# Patient Record
Sex: Male | Born: 1975 | Race: Black or African American | Hispanic: No | Marital: Single | Smoking: Former smoker
Health system: Southern US, Community
[De-identification: ages and names within clinical notes are randomized; demographics above are authoritative.]

## PROBLEM LIST (undated history)

## (undated) DIAGNOSIS — G35 Multiple sclerosis: Secondary | ICD-10-CM

## (undated) HISTORY — DX: Multiple sclerosis: G35

---

## 2000-11-14 ENCOUNTER — Emergency Department (HOSPITAL_COMMUNITY): Admission: EM | Admit: 2000-11-14 | Discharge: 2000-11-14 | Payer: Self-pay

## 2001-09-16 ENCOUNTER — Encounter: Payer: Self-pay | Admitting: Emergency Medicine

## 2001-09-16 ENCOUNTER — Emergency Department (HOSPITAL_COMMUNITY): Admission: EM | Admit: 2001-09-16 | Discharge: 2001-09-16 | Payer: Self-pay | Admitting: Emergency Medicine

## 2001-09-25 ENCOUNTER — Emergency Department (HOSPITAL_COMMUNITY): Admission: EM | Admit: 2001-09-25 | Discharge: 2001-09-25 | Payer: Self-pay | Admitting: Emergency Medicine

## 2002-11-14 ENCOUNTER — Emergency Department (HOSPITAL_COMMUNITY): Admission: EM | Admit: 2002-11-14 | Discharge: 2002-11-14 | Payer: Self-pay | Admitting: *Deleted

## 2010-03-23 ENCOUNTER — Emergency Department (HOSPITAL_COMMUNITY)
Admission: EM | Admit: 2010-03-23 | Discharge: 2010-03-23 | Payer: Self-pay | Source: Home / Self Care | Admitting: Emergency Medicine

## 2010-10-20 ENCOUNTER — Emergency Department (HOSPITAL_COMMUNITY)
Admission: EM | Admit: 2010-10-20 | Discharge: 2010-10-20 | Disposition: A | Discharge status: Home / Self Care | Payer: Self-pay | Attending: Emergency Medicine | Admitting: Emergency Medicine

## 2010-10-20 DIAGNOSIS — K047 Periapical abscess without sinus: Secondary | ICD-10-CM | POA: Insufficient documentation

## 2010-10-20 DIAGNOSIS — K029 Dental caries, unspecified: Secondary | ICD-10-CM | POA: Insufficient documentation

## 2010-10-20 DIAGNOSIS — R221 Localized swelling, mass and lump, neck: Secondary | ICD-10-CM | POA: Insufficient documentation

## 2010-10-20 DIAGNOSIS — R220 Localized swelling, mass and lump, head: Secondary | ICD-10-CM | POA: Insufficient documentation

## 2010-10-20 DIAGNOSIS — K089 Disorder of teeth and supporting structures, unspecified: Secondary | ICD-10-CM | POA: Insufficient documentation

## 2010-10-24 ENCOUNTER — Emergency Department (HOSPITAL_COMMUNITY)
Admission: EM | Admit: 2010-10-24 | Discharge: 2010-10-25 | Disposition: A | Discharge status: Home / Self Care | Payer: Self-pay | Attending: Emergency Medicine | Admitting: Emergency Medicine

## 2010-10-24 ENCOUNTER — Emergency Department (HOSPITAL_COMMUNITY): Payer: Self-pay

## 2010-10-24 DIAGNOSIS — R197 Diarrhea, unspecified: Secondary | ICD-10-CM | POA: Insufficient documentation

## 2010-10-24 DIAGNOSIS — K029 Dental caries, unspecified: Secondary | ICD-10-CM | POA: Insufficient documentation

## 2010-10-24 DIAGNOSIS — R112 Nausea with vomiting, unspecified: Secondary | ICD-10-CM | POA: Insufficient documentation

## 2010-10-24 DIAGNOSIS — K047 Periapical abscess without sinus: Secondary | ICD-10-CM | POA: Insufficient documentation

## 2010-10-24 LAB — COMPREHENSIVE METABOLIC PANEL
ALT: 38 U/L (ref 0–53)
AST: 43 U/L — ABNORMAL HIGH (ref 0–37)
Albumin: 5.6 g/dL — ABNORMAL HIGH (ref 3.5–5.2)
Alkaline Phosphatase: 96 U/L (ref 39–117)
BUN: 17 mg/dL (ref 6–23)
CO2: 18 mEq/L — ABNORMAL LOW (ref 19–32)
Calcium: 10.8 mg/dL — ABNORMAL HIGH (ref 8.4–10.5)
Chloride: 102 mEq/L (ref 96–112)
Creatinine, Ser: 0.97 mg/dL (ref 0.50–1.35)
GFR calc Af Amer: >60 mL/min (ref >60)
GFR calc non Af Amer: >60 mL/min (ref >60)
Glucose, Bld: 137 mg/dL — ABNORMAL HIGH (ref 70–99)
Potassium: 3.8 mEq/L (ref 3.5–5.1)
Sodium: 140 mEq/L (ref 135–145)
Total Bilirubin: 0.6 mg/dL (ref 0.3–1.2)
Total Protein: 9 g/dL — ABNORMAL HIGH (ref 6.0–8.3)

## 2010-10-24 LAB — DIFFERENTIAL
Basophils Absolute: 0 10*3/uL (ref 0.0–0.1)
Basophils Relative: 0 % (ref 0–1)
Eosinophils Absolute: 0 10*3/uL (ref 0.0–0.7)
Eosinophils Relative: 0 % (ref 0–5)
Lymphocytes Relative: 7 % — ABNORMAL LOW (ref 12–46)
Lymphs Abs: 1.7 10*3/uL (ref 0.7–4.0)
Monocytes Absolute: 0.7 10*3/uL (ref 0.1–1.0)
Monocytes Relative: 3 % (ref 3–12)
Neutro Abs: 21.2 10*3/uL — ABNORMAL HIGH (ref 1.7–7.7)
Neutrophils Relative %: 90 % — ABNORMAL HIGH (ref 43–77)

## 2010-10-24 LAB — CBC
HCT: 41.7 % (ref 39.0–52.0)
Hemoglobin: 14.6 g/dL (ref 13.0–17.0)
MCH: 31.5 pg (ref 26.0–34.0)
MCHC: 35 g/dL (ref 30.0–36.0)
MCV: 89.9 fL (ref 78.0–100.0)
Platelets: 299 10*3/uL (ref 150–400)
RBC: 4.64 MIL/uL (ref 4.22–5.81)
RDW: 12.7 % (ref 11.5–15.5)
WBC: 23.6 10*3/uL — ABNORMAL HIGH (ref 4.0–10.5)

## 2010-10-24 LAB — LIPASE, BLOOD: Lipase: 8 U/L — ABNORMAL LOW (ref 11–59)

## 2010-10-24 LAB — GLUCOSE, CAPILLARY: Glucose-Capillary: 135 mg/dL — ABNORMAL HIGH (ref 70–99)

## 2010-10-24 MED ORDER — IOHEXOL 300 MG/ML  SOLN
75.0000 mL | Freq: Once | INTRAMUSCULAR | Status: AC | PRN
  Administered 2010-10-24: 75 mL via INTRAVENOUS

## 2010-10-25 LAB — URINALYSIS, ROUTINE W REFLEX MICROSCOPIC
Bilirubin Urine: NEGATIVE
Ketones, ur: >80 mg/dL — AB
Leukocytes, UA: NEGATIVE
Nitrite: NEGATIVE
Protein, ur: NEGATIVE mg/dL
Urobilinogen, UA: 0.2 mg/dL (ref 0.0–1.0)
pH: 8 (ref 5.0–8.0)

## 2012-12-08 IMAGING — CT CT NECK W/ CM
3 series · 16 of 33 positions shown, 19 images · IV contrast (agent unspecified)
Comparison: None.

CLINICAL DATA: Nausea and vomiting.  Difficulty opening jaw.

CT NECK WITH CONTRAST
TECHNIQUE: Multidetector CT imaging of the neck was performed with
intravenous contrast.
Contrast: 75 ml 1mnipaque-LUU

[Series 3: st neck 2.0 b31s · axial · 0.55mm/px · z∈[+858,+1056]mm · 8 of 117 slices shown, 10 images]
[im 9/117  soft-tissue]
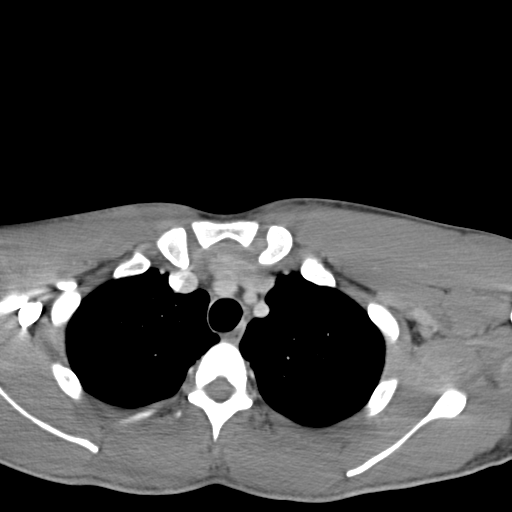
[im 9/117  bone]
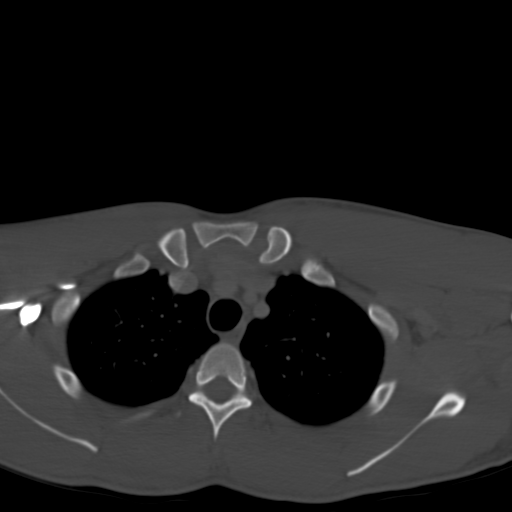
[im 27/117  bone]
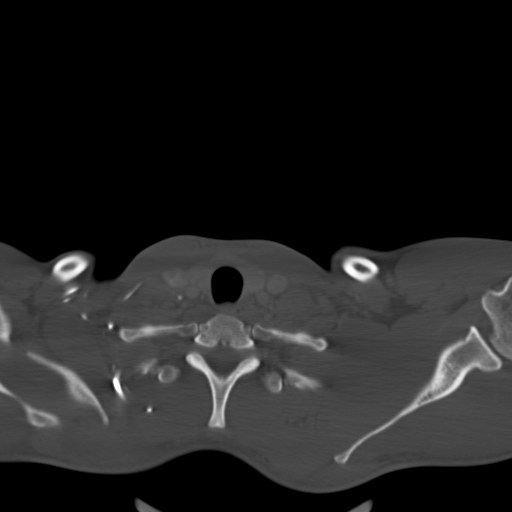
[im 36/117  bone]
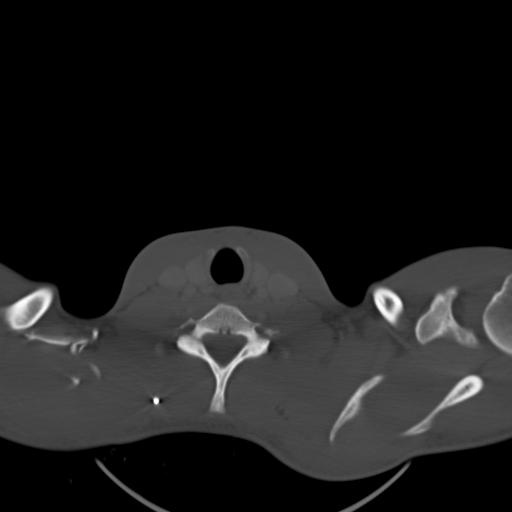
[im 54/117  bone]
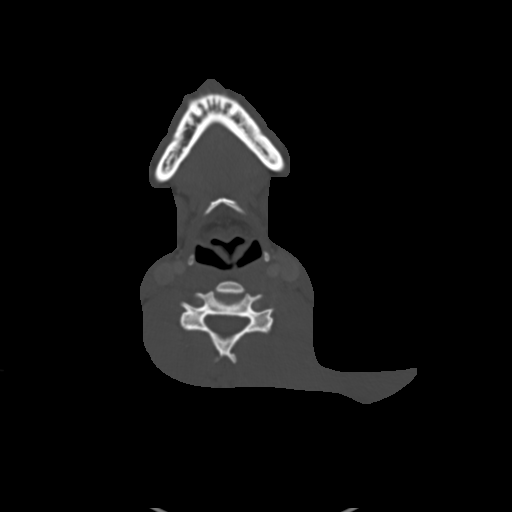
[im 63/117  soft-tissue]
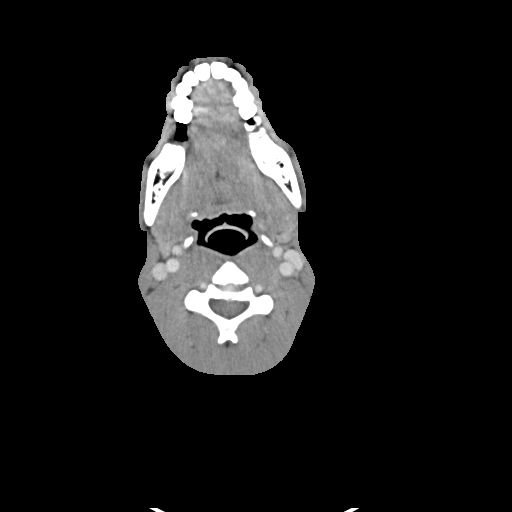
[im 63/117  bone]
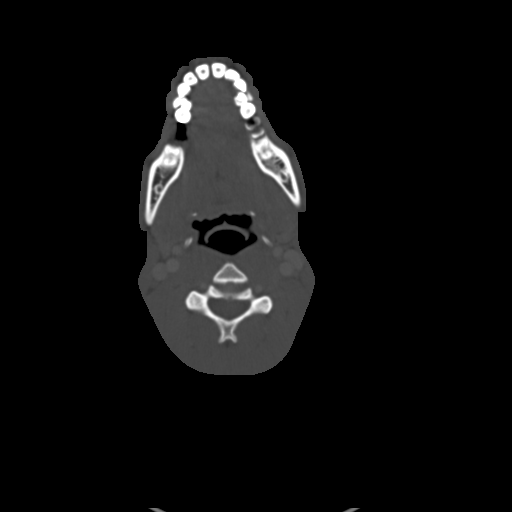
[im 81/117  bone]
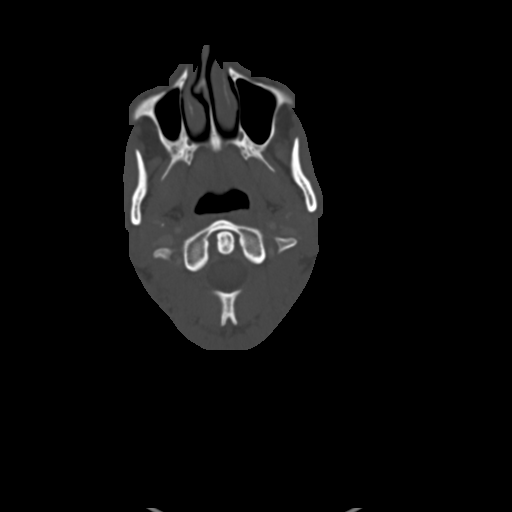
[im 90/117  bone]
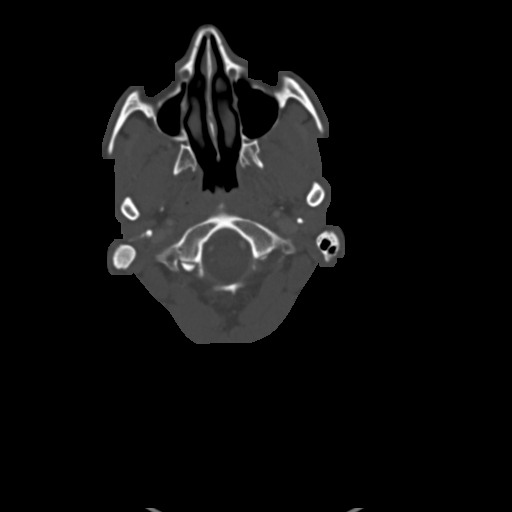
[im 108/117  bone]
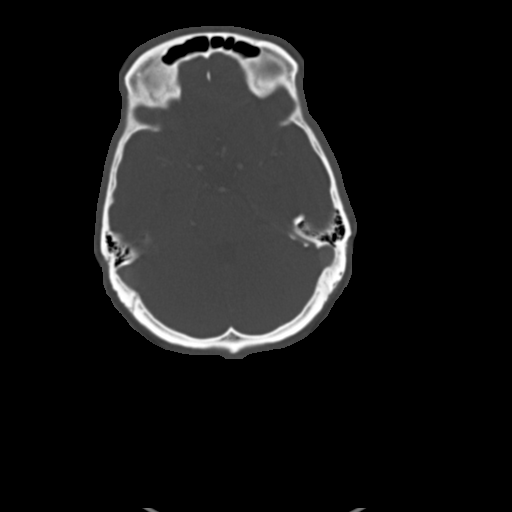

[Series 602: cor · coronal · 0.55mm/px · 3 of 101 slices shown]
[im 21/101  bone]
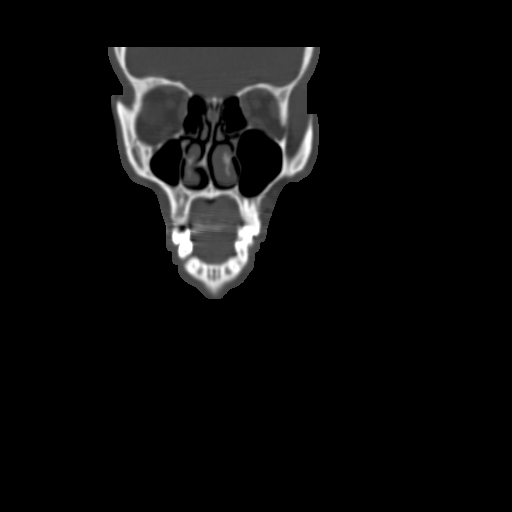
[im 41/101  bone]
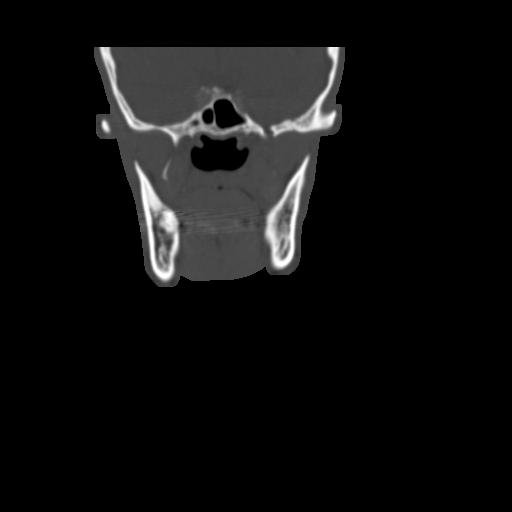
[im 61/101  bone]
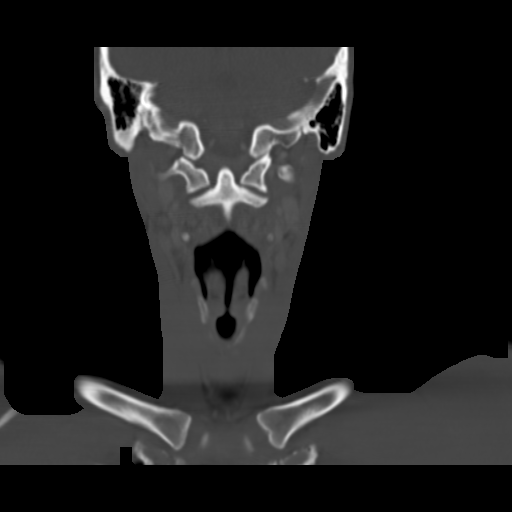

[Series 603: sag · sagittal · 0.55mm/px · 5 of 66 slices shown, 6 images]
[im 22/66  bone]
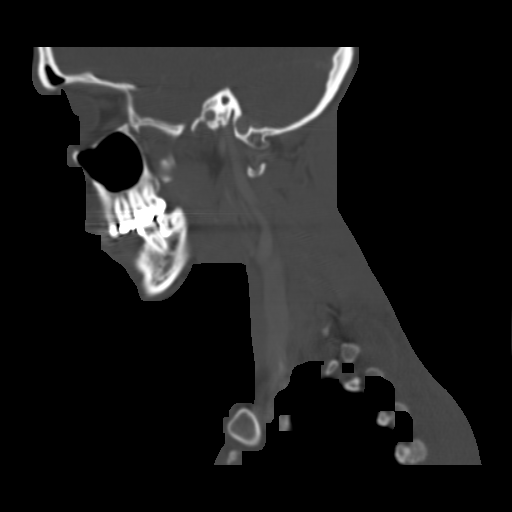
[im 28/66  bone]
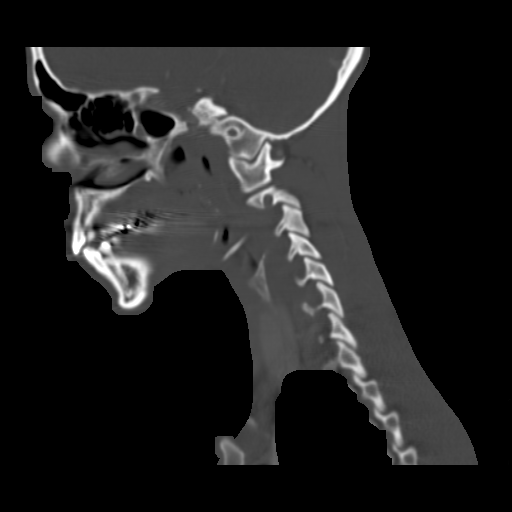
[im 33/66  soft-tissue]
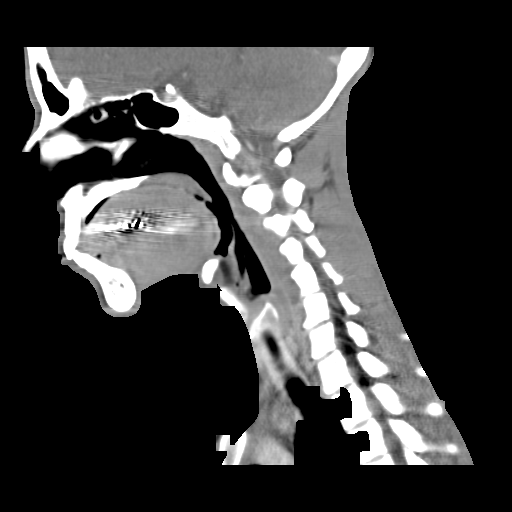
[im 33/66  bone]
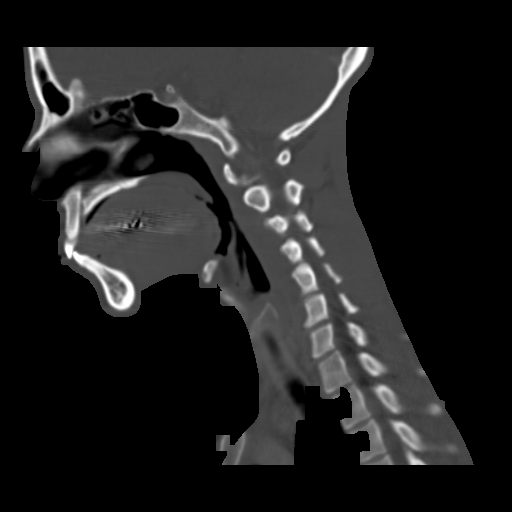
[im 38/66  bone]
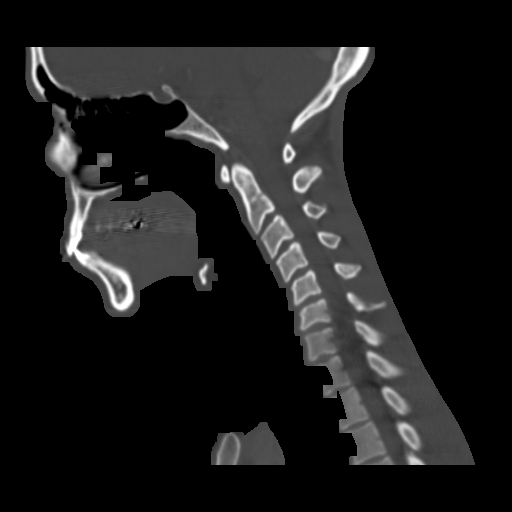
[im 44/66  bone]
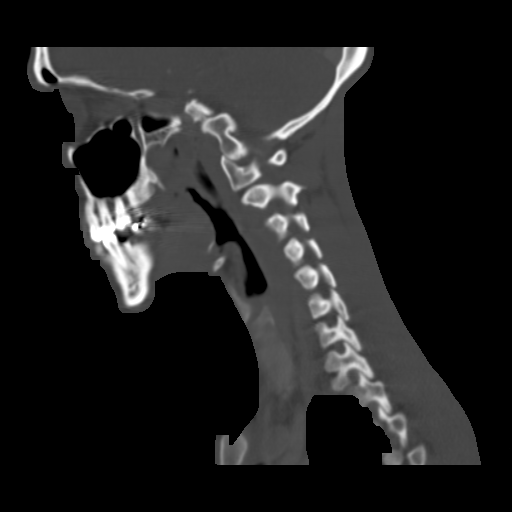

[16 of 33 positions shown; findings below may reference images not displayed]

FINDINGS: The mandibular condyles appear normal.

There is a cavity of tooth #19 along with periapical lucency.

I do not observe an abscess.

No pathologic adenopathy in the neck is identified.
No significant asymmetry of density along the mass and the mass or
buccinator musculature.
IMPRESSION: 1.  Cavity of tooth #19 with periapical lucency in the mandible.
No discrete abscess.  No significant asymmetry in the soft tissues
adjacent to the jaw.

## 2013-02-19 ENCOUNTER — Ambulatory Visit (INDEPENDENT_AMBULATORY_CARE_PROVIDER_SITE_OTHER): Payer: BC Managed Care – PPO | Admitting: Diagnostic Neuroimaging

## 2013-02-19 ENCOUNTER — Encounter (INDEPENDENT_AMBULATORY_CARE_PROVIDER_SITE_OTHER): Payer: Self-pay

## 2013-02-19 ENCOUNTER — Encounter: Payer: Self-pay | Admitting: Diagnostic Neuroimaging

## 2013-02-19 VITALS — BP 118/70 | HR 64 | Temp 98.1°F | Ht 67.0 in | Wt 133.0 lb

## 2013-02-19 DIAGNOSIS — R209 Unspecified disturbances of skin sensation: Secondary | ICD-10-CM

## 2013-02-19 DIAGNOSIS — H4921 Sixth [abducent] nerve palsy, right eye: Secondary | ICD-10-CM

## 2013-02-19 DIAGNOSIS — H4920 Sixth [abducent] nerve palsy, unspecified eye: Secondary | ICD-10-CM

## 2013-02-19 DIAGNOSIS — R42 Dizziness and giddiness: Secondary | ICD-10-CM

## 2013-02-19 DIAGNOSIS — R51 Headache: Secondary | ICD-10-CM

## 2013-02-19 DIAGNOSIS — R200 Anesthesia of skin: Secondary | ICD-10-CM

## 2013-02-19 NOTE — Patient Instructions (Signed)
I will check MRI and lab testing. 

## 2013-02-19 NOTE — Progress Notes (Signed)
[facility]  PATIENT: Shane Rollins DOB: 05/28/1975  REFERRING CLINICIAN: Harlon Flor HISTORY FROM: patient REASON FOR VISIT: new consult   HISTORICAL  CHIEF COMPLAINT:  Chief Complaint  Patient presents with  . Diplopia    R eye  . Numbness    L side    HISTORY OF PRESENT ILLNESS:   37 year old right-handed male here for evaluation of double vision.  2 weeks ago patient sudden onset double vision when looking to the right side. 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 Symptoms have been stable since that time. When patient covers one eye he does not have double vision. Symptoms are worse when he looks to the right or when he is outdoors.  No prodromal illness, infection, cough, cold, change in diet or exercise. No trauma as.  In September an episode of left arm and left leg tingling sensation lasting for several hours.  Patient had intermittent headaches and dizziness.   REVIEW OF SYSTEMS: Full 14 system review of systems performed and notable only for double vision fatigue many years.  ALLERGIES: No Known Allergies  HOME MEDICATIONS: No outpatient prescriptions prior to visit.   No facility-administered medications prior to visit.    PAST MEDICAL HISTORY: History reviewed. No pertinent past medical history.  PAST SURGICAL HISTORY: History reviewed. No pertinent past surgical history.  FAMILY HISTORY: Family History  Problem Relation Age of Onset  . Congestive Heart Failure    . Depression      SOCIAL HISTORY:  History   Social History  . Marital Status: Single    Spouse Name: N/A    Number of Children: 0  . Years of Education: BA   Occupational History  .  [employer]   Social History Main Topics  . Smoking status: Former Smoker -- 0.50 packs/day for 12 years    Types: Cigarettes    Quit date: 03/15/2007  . Smokeless tobacco: Never Used  . Alcohol Use: Yes     Comment: 2 drinks  weekly  . Drug Use: No  . Sexual Activity: Not on file   Other Topics Concern  . Not on file   Social History Narrative   Patient lives at home alone.   Caffeine Use: 1 cup daily     PHYSICAL EXAM  Filed Vitals:   02/19/13 1005  BP: 118/70  Pulse: 64  Temp: 98.1 F (36.7 C)  TempSrc: Oral  Height: 5\' 7"  (1.702 m)  Weight: 133 lb (60.328 kg)    Not recorded    Body mass index is 20.83 kg/(m^2).  GENERAL EXAM: Patient is in no distress; well developed, nourished and groomed; neck is supple  CARDIOVASCULAR: Regular rate and rhythm, no murmurs, no carotid bruits  NEUROLOGIC: MENTAL STATUS: awake, alert, oriented to person, place and time, recent and remote memory intact, normal attention and concentration, language fluent, comprehension intact, naming intact, fund of knowledge appropriate CRANIAL NERVE: no papilledema on fundoscopic exam, pupils equal and reactive to light, visual fields full to confrontation, extraocular muscles: RIGHT LATERAL RECTUS WEAKNESS WITH SUBJECTIVE BINOCULAR DOUBLE VISION ON RIGHT GAZE. No nystagmus, facial sensation and strength symmetric, hearing intact, palate elevates symmetrically, uvula midline, shoulder shrug symmetric, tongue midline. MOTOR: normal bulk and tone, full strength in the BUE, BLE SENSORY: normal and symmetric to light touch, temperature, vibration and proprioception COORDINATION: finger-nose-finger, fine finger movements normal REFLEXES: deep tendon reflexes present and symmetric GAIT/STATION: narrow based gait; able to walk  on toes, heels and tandem; romberg is negative    DIAGNOSTIC DATA (LABS, IMAGING, TESTING) - I reviewed patient records, labs, notes, testing and imaging myself where available.  Lab Results  Component Value Date   WBC 23.6* 10/24/2010   HGB 14.6 10/24/2010   HCT 41.7 10/24/2010   MCV 89.9 10/24/2010   PLT 299 10/24/2010      Component Value Date/Time   NA 140 10/24/2010 2234   K 3.8 10/24/2010  2234   CL 102 10/24/2010 2234   CO2 18* 10/24/2010 2234   GLUCOSE 137* 10/24/2010 2234   BUN 17 10/24/2010 2234   CREATININE 0.97 10/24/2010 2234   CALCIUM 10.8* 10/24/2010 2234   PROT 9.0* 10/24/2010 2234   ALBUMIN 5.6* 10/24/2010 2234   AST 43* 10/24/2010 2234   ALT 38 10/24/2010 2234   ALKPHOS 96 10/24/2010 2234   BILITOT 0.6 10/24/2010 2234   GFRNONAA >60 10/24/2010 2234   GFRAA >60 10/24/2010 2234   No results found for this basename: CHOL, HDL, LDLCALC, LDLDIRECT, TRIG, CHOLHDL   No results found for this basename: HGBA1C   No results found for this basename: VITAMINB12   No results found for this basename: TSH    No imaging in system.   ASSESSMENT AND PLAN  37 y.o. year old male here with sudden onset double vision 2 weeks ago. No improvement in symptoms since that time. We'll need to proceed with an extensive testing to look for etiologies.  Localization: right CN6 (nerve vs nucleus) vs right lateral rectus muscle   Ddx: metabolic, autoimmune, inflamm, post-infx, structural  PLAN: Orders Placed This Encounter  Procedures  . MR Brain W Wo Contrast  . MR MRA HEAD WO CONTRAST  . CBC With differential/Platelet  . Comprehensive metabolic panel  . Hemoglobin A1c  . Sedimentation Rate  . C-reactive Protein  . ANA w/Reflex if Positive  . Acetylcholine Receptor, Binding  . Angiotensin converting enzyme  . TSH  . Lyme, Total Ab Test/Reflex   Return in about 1 month (around 03/22/2013).  I reviewed records, labs, notes myself. I summarized findings and reviewed with patient, for this high risk condition (sudden right CN6 palsy) requiring high complexity decision making.   Suanne Marker, MD 02/19/2013, 11:08 AM Certified in Neurology, Neurophysiology and Neuroimaging  [facility] [address] [place], [place] [address] [phone]

## 2013-02-20 LAB — CBC WITH DIFFERENTIAL
Basophils Absolute: 0.1 10*3/uL (ref 0.0–0.2)
Basos: 1 %
Eosinophils Absolute: 0.1 10*3/uL (ref 0.0–0.4)
Hemoglobin: 14.7 g/dL (ref 12.6–17.7)
Immature Grans (Abs): 0 10*3/uL (ref 0.0–0.1)
Lymphs: 33 %
MCH: 31.7 pg (ref 26.6–33.0)
MCHC: 35.1 g/dL (ref 31.5–35.7)
Monocytes Absolute: 0.5 10*3/uL (ref 0.1–0.9)
Neutrophils Relative %: 58 %
RDW: 13 % (ref 12.3–15.4)

## 2013-02-20 LAB — COMPREHENSIVE METABOLIC PANEL
Alkaline Phosphatase: 78 IU/L (ref 39–117)
Chloride: 100 mmol/L (ref 97–108)
GFR calc Af Amer: 127 mL/min/{1.73_m2} (ref >59)
GFR calc non Af Amer: 110 mL/min/{1.73_m2} (ref >59)
Globulin, Total: 2.5 g/dL (ref 1.5–4.5)
Potassium: 4.8 mmol/L (ref 3.5–5.2)
Sodium: 140 mmol/L (ref 134–144)

## 2013-02-20 LAB — SEDIMENTATION RATE: Sed Rate: 2 mm/hr (ref 0–15)

## 2013-02-20 LAB — C-REACTIVE PROTEIN: CRP: 0.8 mg/L (ref 0.0–4.9)

## 2013-02-20 LAB — HEMOGLOBIN A1C: Est. average glucose Bld gHb Est-mCnc: 114 mg/dL

## 2013-02-20 LAB — ANA W/REFLEX IF POSITIVE: Anti Nuclear Antibody(ANA): NEGATIVE

## 2013-02-20 LAB — ACETYLCHOLINE RECEPTOR, BINDING: AChR Binding Ab, Serum: 0.08 nmol/L (ref 0.00–0.24)

## 2013-02-22 DIAGNOSIS — H4920 Sixth [abducent] nerve palsy, unspecified eye: Secondary | ICD-10-CM | POA: Insufficient documentation

## 2013-02-25 ENCOUNTER — Telehealth: Payer: Self-pay | Admitting: Diagnostic Neuroimaging

## 2013-02-25 NOTE — Telephone Encounter (Signed)
Calling for lab results. °

## 2013-02-25 NOTE — Telephone Encounter (Signed)
Please advise 

## 2013-02-28 ENCOUNTER — Ambulatory Visit (INDEPENDENT_AMBULATORY_CARE_PROVIDER_SITE_OTHER): Payer: BC Managed Care – PPO

## 2013-02-28 DIAGNOSIS — H4921 Sixth [abducent] nerve palsy, right eye: Secondary | ICD-10-CM

## 2013-02-28 DIAGNOSIS — H4920 Sixth [abducent] nerve palsy, unspecified eye: Secondary | ICD-10-CM

## 2013-02-28 NOTE — Telephone Encounter (Signed)
Patient stopped at checkout after MRI to check the status of his results.

## 2013-03-01 MED ORDER — GADOPENTETATE DIMEGLUMINE 469.01 MG/ML IV SOLN
12.0000 mL | Freq: Once | INTRAVENOUS | Status: AC | PRN
Start: 2013-03-01 — End: 2013-03-01

## 2013-03-01 NOTE — Telephone Encounter (Signed)
I have talked with the patient and he agreed to wait until the beginning of next week for his results.

## 2013-03-01 NOTE — Telephone Encounter (Signed)
I called patient to give MRI results, but no answer. Will try again on Monday, and offer follow appt for Mon or Tues. -VRP

## 2013-03-04 ENCOUNTER — Ambulatory Visit (INDEPENDENT_AMBULATORY_CARE_PROVIDER_SITE_OTHER): Payer: BC Managed Care – PPO | Admitting: Diagnostic Neuroimaging

## 2013-03-04 ENCOUNTER — Encounter: Payer: Self-pay | Admitting: Diagnostic Neuroimaging

## 2013-03-04 VITALS — BP 123/73 | HR 73 | Ht 67.0 in | Wt 132.0 lb

## 2013-03-04 DIAGNOSIS — H4921 Sixth [abducent] nerve palsy, right eye: Secondary | ICD-10-CM

## 2013-03-04 DIAGNOSIS — R200 Anesthesia of skin: Secondary | ICD-10-CM

## 2013-03-04 DIAGNOSIS — R42 Dizziness and giddiness: Secondary | ICD-10-CM

## 2013-03-04 DIAGNOSIS — R930 Abnormal findings on diagnostic imaging of skull and head, not elsewhere classified: Secondary | ICD-10-CM

## 2013-03-04 DIAGNOSIS — R209 Unspecified disturbances of skin sensation: Secondary | ICD-10-CM

## 2013-03-04 DIAGNOSIS — R9089 Other abnormal findings on diagnostic imaging of central nervous system: Secondary | ICD-10-CM

## 2013-03-04 DIAGNOSIS — R51 Headache: Secondary | ICD-10-CM

## 2013-03-04 DIAGNOSIS — H4920 Sixth [abducent] nerve palsy, unspecified eye: Secondary | ICD-10-CM

## 2013-03-04 NOTE — Progress Notes (Signed)
[facility]  PATIENT: Shane Rollins DOB: [birth year]  REFERRING CLINICIAN: Whitaker HISTORY FROM: patient and friend REASON FOR VISIT: follow up   HISTORICAL  CHIEF COMPLAINT:  Chief Complaint  Patient presents with  . Follow-up    6 nerve palsy    HISTORY OF PRESENT ILLNESS:   UPDATE 03/04/13: Since last visit, symptoms stable. MRI results reviewed. No new events.  PRIOR HPI (02/19/13): 37 year old right-handed male here for evaluation of double vision.  2 weeks ago patient sudden onset double vision when looking to the right side. 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 Symptoms have been stable since that time. When patient covers one eye he does not have double vision. Symptoms are worse when he looks to the right or when he is outdoors.  No prodromal illness, infection, cough, cold, change in diet or exercise. No traumas.  In September an episode of left arm and left leg tingling sensation lasting for several hours.  Patient had intermittent headaches and dizziness.   REVIEW OF SYSTEMS: Full 14 system review of systems performed and notable only for double vision numbness.  ALLERGIES: No Known Allergies  HOME MEDICATIONS: Outpatient Prescriptions Prior to Visit  Medication Sig Dispense Refill  . vitamin B-12 (CYANOCOBALAMIN) 100 MCG tablet Take 100 mcg by mouth daily.       No facility-administered medications prior to visit.    PAST MEDICAL HISTORY: History reviewed. No pertinent past medical history.  PAST SURGICAL HISTORY: History reviewed. No pertinent past surgical history.  FAMILY HISTORY: Family History  Problem Relation Age of Onset  . Congestive Heart Failure    . Depression      SOCIAL HISTORY:  History   Social History  . Marital Status: Single    Spouse Name: N/A    Number of Children: 0  . Years of Education: BA   Occupational History  .  [employer]   Social History  Main Topics  . Smoking status: Former Smoker -- 0.50 packs/day for 12 years    Types: Cigarettes    Quit date: 03/15/2007  . Smokeless tobacco: Never Used  . Alcohol Use: Yes     Comment: 2 drinks weekly  . Drug Use: No  . Sexual Activity: Not on file   Other Topics Concern  . Not on file   Social History Narrative   Patient lives at home alone.   Caffeine Use: 1 cup daily     PHYSICAL EXAM  Filed Vitals:   03/04/13 1350  BP: 123/73  Pulse: 73  Height: 5\' 7"  (1.702 m)  Weight: 132 lb (59.875 kg)    Not recorded    Body mass index is 20.67 kg/(m^2).  GENERAL EXAM: Patient is in no distress; well developed, nourished and groomed; neck is supple  CARDIOVASCULAR: Regular rate and rhythm, no murmurs, no carotid bruits  NEUROLOGIC: MENTAL STATUS: awake, alert, oriented to person, place and time, recent and remote memory intact, normal attention and concentration, language fluent, comprehension intact, naming intact, fund of knowledge appropriate CRANIAL NERVE: no papilledema on fundoscopic exam, pupils equal and reactive to light, visual fields full to confrontation, extraocular muscles: RIGHT LATERAL RECTUS WEAKNESS WITH SUBJECTIVE BINOCULAR DOUBLE VISION ON RIGHT GAZE. No nystagmus, facial sensation and strength symmetric, hearing intact, palate elevates symmetrically, uvula midline, shoulder shrug symmetric, tongue midline. MOTOR: normal bulk and tone, full strength in the BUE, BLE SENSORY: normal and symmetric to light touch COORDINATION: finger-nose-finger,  fine finger movements normal REFLEXES: deep tendon reflexes present and symmetric GAIT/STATION: narrow based gait; able to walk tandem; romberg is negative    DIAGNOSTIC DATA (LABS, IMAGING, TESTING) - I reviewed patient records, labs, notes, testing and imaging myself where available.  Lab Results  Component Value Date   WBC 7.5 02/19/2013   HGB 14.7 02/19/2013   HCT 41.9 02/19/2013   MCV 90 02/19/2013   PLT  270 02/19/2013      Component Value Date/Time   NA 140 02/19/2013 1127   NA 140 10/24/2010 2234   K 4.8 02/19/2013 1127   CL 100 02/19/2013 1127   CO2 24 02/19/2013 1127   GLUCOSE 81 02/19/2013 1127   GLUCOSE 137* 10/24/2010 2234   BUN 12 02/19/2013 1127   BUN 17 10/24/2010 2234   CREATININE 0.90 02/19/2013 1127   CALCIUM 10.5* 02/19/2013 1127   PROT 7.6 02/19/2013 1127   PROT 9.0* 10/24/2010 2234   ALBUMIN 5.6* 10/24/2010 2234   AST 24 02/19/2013 1127   ALT 20 02/19/2013 1127   ALKPHOS 78 02/19/2013 1127   BILITOT 0.4 02/19/2013 1127   GFRNONAA 110 02/19/2013 1127   GFRAA 127 02/19/2013 1127   No results found for this basename: CHOL,  HDL,  LDLCALC,  LDLDIRECT,  TRIG,  CHOLHDL   Lab Results  Component Value Date   HGBA1C 5.6 02/19/2013   No results found for this basename: VITAMINB12   Lab Results  Component Value Date   TSH 1.320 02/19/2013   I reviewed images myself and agree with interpretation. -VRP  02/28/13 MRI brain - Multiple (9 periventricular and subcortical, 2 pontine and 1 medullary ) T2 hyperintensities. The largest is in the left frontal periventricular region, which also has partial rim enhancement. Findings suspicious for acute and chronic demyelinating plaques. Other autoimmune, inflammatory or post-infectious etiologies are considerations.  02/28/13 MRA head - normal   ASSESSMENT AND PLAN  37 y.o. year old male here with sudden onset double vision 2 weeks ago. No improvement in symptoms since that time. MRI brain is suspicious for multiple sclerosis. Will check labs (for MS mimics), MRI c + t spine, then consider IV steroids, followed by disease modifying therapy.  Ddx: multiple sclerosis, CNS autoimmune, inflamm, post-infx   PLAN: Orders Placed This Encounter  Procedures  . MR Cervical Spine W Wo Contrast  . MR Thoracic Spine W Wo Contrast  . Pan-ANCA  . HIV antibody  . Quantiferon tb gold assay  . Hep B Surface Antibody  . Hep B Surface Antigen  . Hepatitis  B Core AB, Total  . Hep C Antibody  . RPR  . NMO IgG Autoantibodies  . Stratify JCV Antibody Test ([facility])  . Vitamin B12   Return in about 3 months (around 06/02/2013).     Suanne Marker, MD 03/04/2013, 2:53 PM Certified in Neurology, Neurophysiology and Neuroimaging  [facility] [address] [place], [place] [address] [phone]

## 2013-03-04 NOTE — Telephone Encounter (Signed)
Patient scheduled/ confirmed 03/04/13...as

## 2013-03-05 ENCOUNTER — Other Ambulatory Visit (INDEPENDENT_AMBULATORY_CARE_PROVIDER_SITE_OTHER): Payer: BC Managed Care – PPO

## 2013-03-05 DIAGNOSIS — Z0289 Encounter for other administrative examinations: Secondary | ICD-10-CM

## 2013-03-06 LAB — HIV ANTIBODY (ROUTINE TESTING W REFLEX)
HIV 1/O/2 Abs-Index Value: <1 (ref <1.00)
HIV-1/HIV-2 Ab: NONREACTIVE

## 2013-03-06 LAB — PAN-ANCA
Atypical pANCA: <1:20 {titer}
C-ANCA: <1:20 {titer}
P-ANCA: <1:20 {titer}

## 2013-03-06 LAB — NMO IGG AUTOANTIBODIES: NMO-IgG: <1.6 U/mL (ref 0.0–3.0)

## 2013-03-06 LAB — VITAMIN B12: Vitamin B-12: 690 pg/mL (ref 211–946)

## 2013-03-07 LAB — QUANTIFERON IN TUBE
QUANTIFERON TB AG VALUE: 0.04 IU/mL
QUANTIFERON TB GOLD: NEGATIVE
Quantiferon Nil Value: 0.04 IU/mL

## 2013-03-07 LAB — QUANTIFERON TB GOLD ASSAY (BLOOD)

## 2013-03-25 ENCOUNTER — Ambulatory Visit
Admission: RE | Admit: 2013-03-25 | Discharge: 2013-03-25 | Disposition: A | Discharge status: Home / Self Care | Payer: BC Managed Care – PPO | Source: Ambulatory Visit | Attending: Diagnostic Neuroimaging | Admitting: Diagnostic Neuroimaging

## 2013-03-25 DIAGNOSIS — R9089 Other abnormal findings on diagnostic imaging of central nervous system: Secondary | ICD-10-CM

## 2013-03-25 DIAGNOSIS — R42 Dizziness and giddiness: Secondary | ICD-10-CM

## 2013-03-25 DIAGNOSIS — R51 Headache: Secondary | ICD-10-CM

## 2013-03-25 DIAGNOSIS — R200 Anesthesia of skin: Secondary | ICD-10-CM

## 2013-03-25 DIAGNOSIS — H4921 Sixth [abducent] nerve palsy, right eye: Secondary | ICD-10-CM

## 2013-03-25 DIAGNOSIS — R209 Unspecified disturbances of skin sensation: Secondary | ICD-10-CM

## 2013-03-25 MED ORDER — GADOBENATE DIMEGLUMINE 529 MG/ML IV SOLN
13.0000 mL | Freq: Once | INTRAVENOUS | Status: AC | PRN
  Administered 2013-03-25: 13 mL via INTRAVENOUS

## 2013-04-04 ENCOUNTER — Telehealth: Payer: Self-pay | Admitting: Diagnostic Neuroimaging

## 2013-04-04 NOTE — Telephone Encounter (Signed)
Patient wanting MRI results. Please advise.  

## 2013-04-04 NOTE — Telephone Encounter (Signed)
Patient would like to know the results of his MRI

## 2013-04-08 NOTE — Telephone Encounter (Signed)
I called pt and relayed per Dr. Marjory Lies that the results of MRI thoracic normal, MRI cervical, one lesion. (suspicious of MS).    (Will be in 04-10-13 to discuss test results and MS meds).

## 2013-04-10 ENCOUNTER — Encounter: Payer: Self-pay | Admitting: Diagnostic Neuroimaging

## 2013-04-10 ENCOUNTER — Ambulatory Visit (INDEPENDENT_AMBULATORY_CARE_PROVIDER_SITE_OTHER): Payer: BC Managed Care – PPO | Admitting: Diagnostic Neuroimaging

## 2013-04-10 VITALS — BP 139/84 | HR 72 | Temp 98.4°F | Ht 67.0 in | Wt 134.0 lb

## 2013-04-10 DIAGNOSIS — G35 Multiple sclerosis: Secondary | ICD-10-CM

## 2013-04-10 MED ORDER — PREDNISONE 10 MG PO TABS
ORAL_TABLET | ORAL | Status: DC
Start: 2013-04-10 — End: 2013-05-24

## 2013-04-10 NOTE — Patient Instructions (Signed)
I will start you on tecfidera.  I will prescribe course of prednisone (steroids) for 12 days; take over the counter prilosec/prevacid daily with steroids.

## 2013-04-10 NOTE — Progress Notes (Signed)
[facility]  PATIENT: Shane Rollins DOB: [birth year]  REFERRING CLINICIAN:  HISTORY FROM: patient and friend and mother REASON FOR VISIT: follow up   HISTORICAL  CHIEF COMPLAINT:  Chief Complaint  Patient presents with  . Follow-up    HISTORY OF PRESENT ILLNESS:   UPDATE 04/10/13: Since last visit, right eye has been slightly improving. No new symptoms. Here to review test results, diagnosis, and treatment options.   UPDATE 03/04/13: Since last visit, symptoms stable. MRI results reviewed. No new events.  PRIOR HPI (02/19/13): 38 year old right-handed male here for evaluation of double vision.  2 weeks ago patient sudden onset double vision when looking to the right side. 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 Symptoms have been stable since that time. When patient covers one eye he does not have double vision. Symptoms are worse when he looks to the right or when he is outdoors.  No prodromal illness, infection, cough, cold, change in diet or exercise. No traumas.  In September an episode of left arm and left leg tingling sensation lasting for several hours.  Patient had intermittent headaches and dizziness.   REVIEW OF SYSTEMS: Full 14 system review of systems performed and notable only for double vision numbness.  ALLERGIES: No Known Allergies  HOME MEDICATIONS: Outpatient Prescriptions Prior to Visit  Medication Sig Dispense Refill  . vitamin B-12 (CYANOCOBALAMIN) 100 MCG tablet Take 100 mcg by mouth daily.       No facility-administered medications prior to visit.    PAST MEDICAL HISTORY: No past medical history on file.  PAST SURGICAL HISTORY: No past surgical history on file.  FAMILY HISTORY: Family History  Problem Relation Age of Onset  . Congestive Heart Failure    . Depression      SOCIAL HISTORY:  History   Social History  . Marital Status: Single    Spouse Name: N/A    Number of Children: 0   . Years of Education: BA   Occupational History  .  [employer]   Social History Main Topics  . Smoking status: Former Smoker -- 0.50 packs/day for 12 years    Types: Cigarettes    Quit date: 03/15/2007  . Smokeless tobacco: Never Used  . Alcohol Use: Yes     Comment: 2 drinks weekly  . Drug Use: No  . Sexual Activity: Not on file   Other Topics Concern  . Not on file   Social History Narrative   Patient lives at home alone.   Caffeine Use: 1 cup daily     PHYSICAL EXAM  Filed Vitals:   04/10/13 0954  BP: 139/84  Pulse: 72  Temp: 98.4 F (36.9 C)  TempSrc: Oral  Height: 5\' 7"  (1.702 m)  Weight: 134 lb (60.782 kg)    Not recorded    Body mass index is 20.98 kg/(m^2).  GENERAL EXAM: Patient is in no distress; well developed, nourished and groomed; neck is supple  CARDIOVASCULAR: Regular rate and rhythm, no murmurs, no carotid bruits  NEUROLOGIC: MENTAL STATUS: awake, alert, oriented to person, place and time, recent and remote memory intact, normal attention and concentration, language fluent, comprehension intact, naming intact, fund of knowledge appropriate CRANIAL NERVE: no papilledema on fundoscopic exam, pupils equal and reactive to light, visual fields full to confrontation, extraocular muscles: SUBJECTIVE BINOCULAR DOUBLE VISION ON RIGHT GAZE. No nystagmus, facial sensation and strength symmetric, hearing intact, palate elevates symmetrically, uvula midline, shoulder shrug  symmetric, tongue midline. MOTOR: normal bulk and tone, full strength in the BUE, BLE SENSORY: normal and symmetric to light touch COORDINATION: finger-nose-finger, fine finger movements normal REFLEXES: deep tendon reflexes present and symmetric GAIT/STATION: narrow based gait; able to walk toe, heel and tandem; romberg is negative    DIAGNOSTIC DATA (LABS, IMAGING, TESTING) - I reviewed patient records, labs, notes, testing and imaging myself where available.  Lab  Results  Component Value Date   WBC 7.5 02/19/2013   HGB 14.7 02/19/2013   HCT 41.9 02/19/2013   MCV 90 02/19/2013   PLT 270 02/19/2013      Component Value Date/Time   NA 140 02/19/2013 1127   NA 140 10/24/2010 2234   K 4.8 02/19/2013 1127   CL 100 02/19/2013 1127   CO2 24 02/19/2013 1127   GLUCOSE 81 02/19/2013 1127   GLUCOSE 137* 10/24/2010 2234   BUN 12 02/19/2013 1127   BUN 17 10/24/2010 2234   CREATININE 0.90 02/19/2013 1127   CALCIUM 10.5* 02/19/2013 1127   PROT 7.6 02/19/2013 1127   PROT 9.0* 10/24/2010 2234   ALBUMIN 5.6* 10/24/2010 2234   AST 24 02/19/2013 1127   ALT 20 02/19/2013 1127   ALKPHOS 78 02/19/2013 1127   BILITOT 0.4 02/19/2013 1127   GFRNONAA 110 02/19/2013 1127   GFRAA 127 02/19/2013 1127   No results found for this basename: CHOL,  HDL,  LDLCALC,  LDLDIRECT,  TRIG,  CHOLHDL   Lab Results  Component Value Date   HGBA1C 5.6 02/19/2013   Lab Results  Component Value Date   VITAMINB12 690 03/04/2013   Lab Results  Component Value Date   TSH 1.320 02/19/2013   I reviewed images myself and agree with interpretation. -VRP  02/28/13 MRI brain - Multiple (9 periventricular and subcortical, 2 pontine and 1 medullary ) T2 hyperintensities. The largest is in the left frontal periventricular region, which also has partial rim enhancement. Findings suspicious for acute and chronic demyelinating plaques. Other autoimmune, inflammatory or post-infectious etiologies are considerations.  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  - Right ponto-medullary T2 hyperintense lesion.  - Findings suspicious for chronic demyelinating disease. Other considerations include autoimmune, inflammatory, post-infectious or ischemic etiologies.  - No acute findings.  03/25/13 MRI thoracic - normal  03/04/13 Labs (B12, NMO ab, RPR, Hep B,  Hep C, HIV, ANCA, ANA, Tb) - all negative/normal  03/04/13 JCV antibody - POSITIVE 1.95   ASSESSMENT AND PLAN  38 y.o. year old male here with sudden onset double vision 2 weeks ago. Slight improvement in symptoms since that time. MRI brain is suspicious for multiple sclerosis. MRI cervical spine shows a spinal cord lesion. MS lab mimics were negative.  Dx: multiple sclerosis   PLAN: - start tecfidera - prednisone course x 12 days - over 1 hour with patient and family discussing treatment options, prognosis and diagnosis  Return in about 3 months (around 07/09/2013).    Suanne MarkerVIKRAM R. PENUMALLI, MD 04/10/2013, 11:34 AM Certified in Neurology, Neurophysiology and Neuroimaging  [facility] [address], [address] [place], [place] [address] [phone]

## 2013-05-24 ENCOUNTER — Encounter: Payer: Self-pay | Admitting: Diagnostic Neuroimaging

## 2013-05-24 ENCOUNTER — Ambulatory Visit (INDEPENDENT_AMBULATORY_CARE_PROVIDER_SITE_OTHER): Payer: BC Managed Care – PPO | Admitting: Diagnostic Neuroimaging

## 2013-05-24 VITALS — BP 115/64 | HR 65 | Temp 98.2°F | Ht 67.0 in | Wt 137.0 lb

## 2013-05-24 DIAGNOSIS — G35 Multiple sclerosis: Secondary | ICD-10-CM

## 2013-05-24 NOTE — Patient Instructions (Signed)
Continue tecfidera  Start general multivitamin

## 2013-05-24 NOTE — Progress Notes (Signed)
[facility]  PATIENT: Shane Rollins DOB: [birth year]  REFERRING CLINICIAN:  HISTORY FROM: patient and friend REASON FOR VISIT: follow up   HISTORICAL  CHIEF COMPLAINT:  Chief Complaint  Patient presents with  . Follow-up    HISTORY OF PRESENT ILLNESS:   UPDATE 05/24/13: Since last visit, vision continue to improve. Now able to drive without eye patch. Some occ double vision with extreme right gaze. Also with intermittent, brief numbness in face, burning pains in left shoulder/scapula. Overall tolerating tecfidera (started 04/24/13), with no sig side effects. Taking meds with foods. Occ "quesey" stomach, but not nausea/vomiting/diarrhea.  UPDATE 04/10/13: Since last visit, right eye has been slightly improving. No new symptoms. Here to review test results, diagnosis, and treatment options.   UPDATE 03/04/13: Since last visit, symptoms stable. MRI results reviewed. No new events.  PRIOR HPI (02/19/13): 38 year old right-handed male here for evaluation of double vision.  2 weeks ago patient sudden onset double vision when looking to the right side. 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 Symptoms have been stable since that time. When patient covers one eye he does not have double vision. Symptoms are worse when he looks to the right or when he is outdoors.  No prodromal illness, infection, cough, cold, change in diet or exercise. No traumas.  In September an episode of left arm and left leg tingling sensation lasting for several hours.  Patient had intermittent headaches and dizziness.   REVIEW OF SYSTEMS: Full 14 system review of systems performed and notable only for double vision numbness fatigue.  ALLERGIES: No Known Allergies  HOME MEDICATIONS: Outpatient Encounter Prescriptions as of 05/24/2013  Medication Sig  . TECFIDERA 120 & 240 MG MISC Take 1 capsule by mouth 2 (two) times daily.  . vitamin B-12 (CYANOCOBALAMIN) 100  MCG tablet Take 100 mcg by mouth daily.  . [DISCONTINUED] predniSONE (DELTASONE) 10 MG tablet Take 60mg  daily x 2 days. Then reduce by 10mg  every 2 days. (60, 60, 50, 50, 40, 40, 30, 30, 20, 20, 10, 10, then stop).    PAST MEDICAL HISTORY: Past Medical History  Diagnosis Date  . Multiple sclerosis    PAST SURGICAL HISTORY: History reviewed. No pertinent past surgical history.  FAMILY HISTORY: Family History  Problem Relation Age of Onset  . Congestive Heart Failure    . Depression      SOCIAL HISTORY:  History   Social History  . Marital Status: Single    Spouse Name: N/A    Number of Children: 0  . Years of Education: BA   Occupational History  .  [employer]   Social History Main Topics  . Smoking status: Former Smoker -- 0.50 packs/day for 12 years    Types: Cigarettes    Quit date: 03/15/2007  . Smokeless tobacco: Never Used  . Alcohol Use: Yes     Comment: 2 drinks weekly  . Drug Use: No  . Sexual Activity: Not on file   Other Topics Concern  . Not on file   Social History Narrative   Patient lives at home alone.   Caffeine Use: 1 cup daily     PHYSICAL EXAM  Filed Vitals:   05/24/13 0831  BP: 115/64  Pulse: 65  Temp: 98.2 F (36.8 C)  TempSrc: Oral  Height: 5\' 7"  (1.702 m)  Weight: 137 lb (62.143 kg)    Not recorded    Body mass index is  21.45 kg/(m^2).  GENERAL EXAM: Patient is in no distress; well developed, nourished and groomed; neck is supple  CARDIOVASCULAR: Regular rate and rhythm, no murmurs, no carotid bruits  NEUROLOGIC: MENTAL STATUS: awake, alert, oriented to person, place and time, recent and remote memory intact, normal attention and concentration, language fluent, comprehension intact, naming intact, fund of knowledge appropriate CRANIAL NERVE: no papilledema on fundoscopic exam, pupils equal and reactive to light, visual fields full to confrontation, extraocular muscles: SUBJECTIVE BINOCULAR DOUBLE VISION ON  EXTREME RIGHT GAZE. No nystagmus, facial sensation and strength symmetric, hearing intact, palate elevates symmetrically, uvula midline, shoulder shrug symmetric, tongue midline. MOTOR: normal bulk and tone, full strength in the BUE, BLE SENSORY: normal and symmetric to light touch COORDINATION: finger-nose-finger, fine finger movements normal REFLEXES: deep tendon reflexes present and symmetric GAIT/STATION: narrow based gait; able to walk toe, heel and tandem; romberg is negative    DIAGNOSTIC DATA (LABS, IMAGING, TESTING) - I reviewed patient records, labs, notes, testing and imaging myself where available.  Lab Results  Component Value Date   WBC 7.5 02/19/2013   HGB 14.7 02/19/2013   HCT 41.9 02/19/2013   MCV 90 02/19/2013   PLT 270 02/19/2013      Component Value Date/Time   NA 140 02/19/2013 1127   NA 140 10/24/2010 2234   K 4.8 02/19/2013 1127   CL 100 02/19/2013 1127   CO2 24 02/19/2013 1127   GLUCOSE 81 02/19/2013 1127   GLUCOSE 137* 10/24/2010 2234   BUN 12 02/19/2013 1127   BUN 17 10/24/2010 2234   CREATININE 0.90 02/19/2013 1127   CALCIUM 10.5* 02/19/2013 1127   PROT 7.6 02/19/2013 1127   PROT 9.0* 10/24/2010 2234   ALBUMIN 5.6* 10/24/2010 2234   AST 24 02/19/2013 1127   ALT 20 02/19/2013 1127   ALKPHOS 78 02/19/2013 1127   BILITOT 0.4 02/19/2013 1127   GFRNONAA 110 02/19/2013 1127   GFRAA 127 02/19/2013 1127   No results found for this basename: CHOL,  HDL,  LDLCALC,  LDLDIRECT,  TRIG,  CHOLHDL   Lab Results  Component Value Date   HGBA1C 5.6 02/19/2013   Lab Results  Component Value Date   VITAMINB12 690 03/04/2013   Lab Results  Component Value Date   TSH 1.320 02/19/2013   I reviewed images myself and agree with interpretation. -VRP  02/28/13 MRI brain - Multiple (9 periventricular and subcortical, 2 pontine and 1 medullary ) T2 hyperintensities. The largest is in the left frontal periventricular region, which also has partial rim enhancement. Findings suspicious for  acute and chronic demyelinating plaques. Other autoimmune, inflammatory or post-infectious etiologies are considerations.  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  - Right ponto-medullary T2 hyperintense lesion.  - Findings suspicious for chronic demyelinating disease. Other considerations include autoimmune, inflammatory, post-infectious or ischemic etiologies.  - No acute findings.  03/25/13 MRI thoracic - normal  03/04/13 Labs (B12, NMO ab, RPR, Hep B, Hep C, HIV, ANCA, ANA, Tb) - all negative/normal  03/04/13 JCV antibody - POSITIVE 1.95 (H)   ASSESSMENT AND PLAN  38 y.o. year old male here with sudden onset double vision in Nov/Dec 2014. MRI brain and c-spine consistent with multiple sclerosis. MS lab mimics were negative. Now on Tecfidera since 04/24/13, and doing well.  Dx: multiple sclerosis   PLAN: - continue tecfidera  Orders Placed This Encounter  Procedures  . CBC With differential/Platelet  . Vit D  25 hydroxy (rtn osteoporosis monitoring)    Return in about 4 months (around 09/23/2013).    Suanne Marker, MD 05/24/2013, 9:01 AM Certified in Neurology, Neurophysiology and Neuroimaging  [facility] [address] [place], [place] [address] [phone]

## 2013-05-25 LAB — CBC WITH DIFFERENTIAL
Basophils Absolute: 0 10*3/uL (ref 0.0–0.2)
Basos: 0 %
EOS: 3 %
Eosinophils Absolute: 0.2 10*3/uL (ref 0.0–0.4)
HCT: 41.9 % (ref 37.5–51.0)
Hemoglobin: 14.1 g/dL (ref 12.6–17.7)
IMMATURE GRANS (ABS): 0 10*3/uL (ref 0.0–0.1)
IMMATURE GRANULOCYTES: 0 %
Lymphocytes Absolute: 1.8 10*3/uL (ref 0.7–3.1)
Lymphs: 29 %
MCH: 31.1 pg (ref 26.6–33.0)
MCHC: 33.7 g/dL (ref 31.5–35.7)
MCV: 93 fL (ref 79–97)
MONOS ABS: 0.6 10*3/uL (ref 0.1–0.9)
Monocytes: 9 %
NEUTROS PCT: 59 %
Neutrophils Absolute: 3.7 10*3/uL (ref 1.4–7.0)
PLATELETS: 288 10*3/uL (ref 150–379)
RBC: 4.53 x10E6/uL (ref 4.14–5.80)
RDW: 14 % (ref 12.3–15.4)
WBC: 6.3 10*3/uL (ref 3.4–10.8)

## 2013-05-25 LAB — VITAMIN D 25 HYDROXY (VIT D DEFICIENCY, FRACTURES): Vit D, 25-Hydroxy: 12.9 ng/mL — ABNORMAL LOW (ref 30.0–100.0)

## 2013-05-27 ENCOUNTER — Telehealth: Payer: Self-pay | Admitting: Diagnostic Neuroimaging

## 2013-05-27 MED ORDER — DIMETHYL FUMARATE 240 MG PO CPDR: 240.0000 mg | DELAYED_RELEASE_CAPSULE | Freq: Two times a day (BID) | ORAL | Status: DC

## 2013-05-27 NOTE — Telephone Encounter (Signed)
I called back.  Pharmacy is [facility].  They asked that we send Rx to them electronically.  I have done so.

## 2013-05-27 NOTE — Telephone Encounter (Signed)
Janine Limbo - (pharmacy) Requesting refill for Tecfidera  [phone]

## 2013-08-29 ENCOUNTER — Encounter: Payer: Self-pay | Admitting: Diagnostic Neuroimaging

## 2013-08-29 ENCOUNTER — Ambulatory Visit (INDEPENDENT_AMBULATORY_CARE_PROVIDER_SITE_OTHER): Payer: BC Managed Care – PPO | Admitting: Diagnostic Neuroimaging

## 2013-08-29 VITALS — BP 107/64 | HR 59 | Ht 67.0 in | Wt 135.0 lb

## 2013-08-29 DIAGNOSIS — G35 Multiple sclerosis: Secondary | ICD-10-CM

## 2013-08-29 DIAGNOSIS — R51 Headache: Secondary | ICD-10-CM

## 2013-08-29 DIAGNOSIS — R61 Generalized hyperhidrosis: Secondary | ICD-10-CM

## 2013-08-29 NOTE — Patient Instructions (Signed)
I will check MRI and labs.  Continue tecfidera.

## 2013-08-29 NOTE — Progress Notes (Signed)
[facility]  PATIENT: Shane Rollins DOB: [birth year]  REFERRING CLINICIAN:  HISTORY FROM: patient and friend REASON FOR VISIT: follow up   HISTORICAL  CHIEF COMPLAINT:  Chief Complaint  Patient presents with  . Follow-up    MS    HISTORY OF PRESENT ILLNESS:   UPDATE 08/29/13: Since last visit doing well. Tolerating tecfidera. 1 month ago had 1 week of night sweats, with sinus pressure and 1 day of fever. Some intermittent headaches with frontal sinus pressure. No nausea, vomiting, photphobia or phonophobia.   UPDATE 05/24/13: Since last visit, vision continue to improve. Now able to drive without eye patch. Some occ double vision with extreme right gaze. Also with intermittent, brief numbness in face, burning pains in left shoulder/scapula. Overall tolerating tecfidera (started 04/24/13), with no sig side effects. Taking meds with foods. Occ "quesey" stomach, but not nausea/vomiting/diarrhea.  UPDATE 04/10/13: Since last visit, right eye has been slightly improving. No new symptoms. Here to review test results, diagnosis, and treatment options.   UPDATE 03/04/13: Since last visit, symptoms stable. MRI results reviewed. No new events.  PRIOR HPI (02/19/13): 38 year old right-handed male here for evaluation of double vision. 2 weeks ago patient sudden onset double vision when looking to the right side. 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 Symptoms have been stable since that time. When patient covers one eye he does not have double vision. Symptoms are worse when he looks to the right or when he is outdoors. No prodromal illness, infection, cough, cold, change in diet or exercise. No traumas. In September an episode of left arm and left leg tingling sensation lasting for several hours. Patient had intermittent headaches and dizziness.  REVIEW OF SYSTEMS: Full 14 system review of systems performed and notable only for headache, night sweats,  numbness.    ALLERGIES: No Known Allergies  HOME MEDICATIONS: Outpatient Encounter Prescriptions as of 08/29/2013  Medication Sig  . Dimethyl Fumarate (TECFIDERA) 240 MG CPDR Take 1 capsule (240 mg total) by mouth 2 (two) times daily.  . vitamin B-12 (CYANOCOBALAMIN) 100 MCG tablet Take 100 mcg by mouth daily.    PAST MEDICAL HISTORY: Past Medical History  Diagnosis Date  . Multiple sclerosis    PAST SURGICAL HISTORY: History reviewed. No pertinent past surgical history.  FAMILY HISTORY: Family History  Problem Relation Age of Onset  . Congestive Heart Failure    . Depression      SOCIAL HISTORY:  History   Social History  . Marital Status: Single    Spouse Name: N/A    Number of Children: 0  . Years of Education: BA   Occupational History  .  [employer]   Social History Main Topics  . Smoking status: Former Smoker -- 0.50 packs/day for 12 years    Types: Cigarettes    Quit date: 03/15/2007  . Smokeless tobacco: Never Used  . Alcohol Use: Yes     Comment: 2 drinks weekly  . Drug Use: No  . Sexual Activity: Not on file   Other Topics Concern  . Not on file   Social History Narrative   Patient lives at home alone.   Caffeine Use: 1 cup daily     PHYSICAL EXAM  Filed Vitals:   08/29/13 1442  BP: 107/64  Pulse: 59  Height: 5\' 7"  (1.702 m)  Weight: 135 lb (61.236 kg)    Not recorded    Body mass index  is 21.14 kg/(m^2).  GENERAL EXAM: Patient is in no distress; well developed, nourished and groomed; neck is supple  CARDIOVASCULAR: Regular rate and rhythm, no murmurs, no carotid bruits  NEUROLOGIC: MENTAL STATUS: awake, alert, oriented to person, place and time, recent and remote memory intact, normal attention and concentration, language fluent, comprehension intact, naming intact, fund of knowledge appropriate CRANIAL NERVE: no papilledema on fundoscopic exam, pupils equal and reactive to light, visual fields full to  confrontation, extraocular muscles: MILD SUBJECTIVE BINOCULAR DOUBLE VISION ON EXTREME RIGHT GAZE. No nystagmus, facial sensation and strength symmetric, hearing intact, palate elevates symmetrically, uvula midline, shoulder shrug symmetric, tongue midline. MOTOR: normal bulk and tone, full strength in the BUE, BLE SENSORY: normal and symmetric to light touch COORDINATION: finger-nose-finger, fine finger movements normal REFLEXES: deep tendon reflexes present and symmetric GAIT/STATION: narrow based gait; able to walk toe, heel and tandem; romberg is negative    DIAGNOSTIC DATA (LABS, IMAGING, TESTING) - I reviewed patient records, labs, notes, testing and imaging myself where available.  Lab Results  Component Value Date   WBC 6.3 05/24/2013   HGB 14.1 05/24/2013   HCT 41.9 05/24/2013   MCV 93 05/24/2013   PLT 288 05/24/2013      Component Value Date/Time   NA 140 02/19/2013 1127   NA 140 10/24/2010 2234   K 4.8 02/19/2013 1127   CL 100 02/19/2013 1127   CO2 24 02/19/2013 1127   GLUCOSE 81 02/19/2013 1127   GLUCOSE 137* 10/24/2010 2234   BUN 12 02/19/2013 1127   BUN 17 10/24/2010 2234   CREATININE 0.90 02/19/2013 1127   CALCIUM 10.5* 02/19/2013 1127   PROT 7.6 02/19/2013 1127   PROT 9.0* 10/24/2010 2234   ALBUMIN 5.6* 10/24/2010 2234   AST 24 02/19/2013 1127   ALT 20 02/19/2013 1127   ALKPHOS 78 02/19/2013 1127   BILITOT 0.4 02/19/2013 1127   GFRNONAA 110 02/19/2013 1127   GFRAA 127 02/19/2013 1127   No results found for this basename: CHOL,  HDL,  LDLCALC,  LDLDIRECT,  TRIG,  CHOLHDL   Lab Results  Component Value Date   HGBA1C 5.6 02/19/2013   Lab Results  Component Value Date   VITAMINB12 690 03/04/2013   Lab Results  Component Value Date   TSH 1.320 02/19/2013   I reviewed images myself and agree with interpretation. -VRP  02/28/13 MRI brain - Multiple (9 periventricular and subcortical, 2 pontine and 1 medullary ) T2 hyperintensities. The largest is in the left frontal  periventricular region, which also has partial rim enhancement. Findings suspicious for acute and chronic demyelinating plaques. Other autoimmune, inflammatory or post-infectious etiologies are considerations.  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  - Right ponto-medullary T2 hyperintense lesion.  - Findings suspicious for chronic demyelinating disease. Other considerations include autoimmune, inflammatory, post-infectious or ischemic etiologies.  - No acute findings.  03/25/13 MRI thoracic - normal  03/04/13 Labs (B12, NMO ab, RPR, Hep B, Hep C, HIV, ANCA, ANA, Tb) - all negative/normal  03/04/13 JCV antibody - POSITIVE 1.95 (H)   ASSESSMENT AND PLAN  38 y.o. year old male here with sudden onset double vision in Nov/Dec 2014. MRI brain and c-spine consistent with multiple sclerosis. MS lab mimics were negative. Now on Tecfidera since 04/24/13, and doing well.  Dx: multiple sclerosis  PLAN: - continue tecfidera -  check CBC, vitamin D, MRI brain  Orders Placed This Encounter  Procedures  . MR Brain W Wo Contrast  . Vit D  25 hydroxy (rtn osteoporosis monitoring)  . CBC With differential/Platelet   Return in about 6 months (around 02/28/2014).    Suanne MarkerVIKRAM R. PENUMALLI, MD 08/29/2013, 3:14 PM Certified in Neurology, Neurophysiology and Neuroimaging  [facility] [address], [address] [place], [place] [id number] [phone]

## 2013-08-30 LAB — CBC WITH DIFFERENTIAL
BASOS ABS: 0 10*3/uL (ref 0.0–0.2)
Basos: 0 %
Eos: 2 %
Eosinophils Absolute: 0.1 10*3/uL (ref 0.0–0.4)
HCT: 41.6 % (ref 37.5–51.0)
Hemoglobin: 13.6 g/dL (ref 12.6–17.7)
IMMATURE GRANULOCYTES: 0 %
Immature Grans (Abs): 0 10*3/uL (ref 0.0–0.1)
LYMPHS ABS: 1.5 10*3/uL (ref 0.7–3.1)
Lymphs: 18 %
MCH: 31.1 pg (ref 26.6–33.0)
MCHC: 32.7 g/dL (ref 31.5–35.7)
MCV: 95 fL (ref 79–97)
MONOCYTES: 7 %
MONOS ABS: 0.6 10*3/uL (ref 0.1–0.9)
Neutrophils Absolute: 6.2 10*3/uL (ref 1.4–7.0)
Neutrophils Relative %: 73 %
PLATELETS: 250 10*3/uL (ref 150–379)
RBC: 4.38 x10E6/uL (ref 4.14–5.80)
RDW: 13.6 % (ref 12.3–15.4)
WBC: 8.5 10*3/uL (ref 3.4–10.8)

## 2013-08-30 LAB — VITAMIN D 25 HYDROXY (VIT D DEFICIENCY, FRACTURES): Vit D, 25-Hydroxy: 45.8 ng/mL (ref 30.0–100.0)

## 2013-09-04 ENCOUNTER — Ambulatory Visit (INDEPENDENT_AMBULATORY_CARE_PROVIDER_SITE_OTHER): Payer: BC Managed Care – PPO

## 2013-09-04 DIAGNOSIS — R61 Generalized hyperhidrosis: Secondary | ICD-10-CM

## 2013-09-04 DIAGNOSIS — G35 Multiple sclerosis: Secondary | ICD-10-CM

## 2013-09-04 DIAGNOSIS — R51 Headache: Secondary | ICD-10-CM

## 2013-09-04 MED ORDER — GADOPENTETATE DIMEGLUMINE 469.01 MG/ML IV SOLN
12.0000 mL | Freq: Once | INTRAVENOUS | Status: AC | PRN
Start: 2013-09-04 — End: 2013-09-04

## 2013-09-05 ENCOUNTER — Telehealth: Payer: Self-pay | Admitting: Diagnostic Neuroimaging

## 2013-09-05 DIAGNOSIS — G35 Multiple sclerosis: Secondary | ICD-10-CM

## 2013-09-05 NOTE — Telephone Encounter (Signed)
I called patient. MRI scan show improvement in some areas, but also 2 new lesions (chronic), that developed sometime between Dec 2014 and Jun 2015. Tecfidera started in Feb 2015. Therefore, we cannot be sure whether these new lesions developed before or after initiating disease modifying therapy. Overall patient is doing well clinically.   PLAN: - We will continue tecfidera for now. Will repeat MRI scan in Dec 2015.   Suanne Marker, MD 09/05/2013, 11:34 AM Certified in Neurology, Neurophysiology and Neuroimaging  [facility] [address], [address] [place], [place] [address] [phone]

## 2013-12-08 ENCOUNTER — Other Ambulatory Visit: Payer: Self-pay

## 2013-12-08 MED ORDER — DIMETHYL FUMARATE 240 MG PO CPDR: 240.0000 mg | DELAYED_RELEASE_CAPSULE | Freq: Two times a day (BID) | ORAL | Status: DC

## 2014-04-14 ENCOUNTER — Ambulatory Visit (INDEPENDENT_AMBULATORY_CARE_PROVIDER_SITE_OTHER): Payer: BC Managed Care – PPO | Admitting: Diagnostic Neuroimaging

## 2014-04-14 ENCOUNTER — Encounter: Payer: Self-pay | Admitting: Diagnostic Neuroimaging

## 2014-04-14 ENCOUNTER — Encounter: Payer: Self-pay | Admitting: *Deleted

## 2014-04-14 VITALS — BP 119/67 | HR 57 | Ht 67.0 in | Wt 132.6 lb

## 2014-04-14 DIAGNOSIS — G35 Multiple sclerosis: Secondary | ICD-10-CM

## 2014-04-14 NOTE — Patient Instructions (Signed)
I will setup additional testing.   Continue tecfidera.

## 2014-04-14 NOTE — Progress Notes (Signed)
[facility]  PATIENT: Shane Rollins DOB: October 28, 1975  REFERRING CLINICIAN:  HISTORY FROM: patient and friend REASON FOR VISIT: follow up   HISTORICAL  CHIEF COMPLAINT:  Chief Complaint  Patient presents with  . Follow-up    multiple sclerosis and headache     HISTORY OF PRESENT ILLNESS:   UPDATE 04/14/14: Since last visit, has had some intermittent facial numbness, night sweats, joint pain. Also with increasing, nagging headaches. He notes that he skips lunch most days. He was drinking Etoh 1-2 drinks per night, but recently quit, to see if HA would improve, but thery haven't changed much.   UPDATE 08/29/13: Since last visit doing well. Tolerating tecfidera. 1 month ago had 1 week of night sweats, with sinus pressure and 1 day of fever. Some intermittent headaches with frontal sinus pressure. No nausea, vomiting, photphobia or phonophobia.   UPDATE 05/24/13: Since last visit, vision continue to improve. Now able to drive without eye patch. Some occ double vision with extreme right gaze. Also with intermittent, brief numbness in face, burning pains in left shoulder/scapula. Overall tolerating tecfidera (started 04/24/13), with no sig side effects. Taking meds with foods. Occ "quesey" stomach, but not nausea/vomiting/diarrhea.  UPDATE 04/10/13: Since last visit, right eye has been slightly improving. No new symptoms. Here to review test results, diagnosis, and treatment options.   UPDATE 03/04/13: Since last visit, symptoms stable. MRI results reviewed. No new events.  PRIOR HPI (02/19/13): 39 year old right-handed male here for evaluation of double vision. 2 weeks ago patient sudden onset double vision when looking to the right side. 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 Symptoms have been stable since that time. When patient covers one eye he does not have double vision. Symptoms are worse when he looks to the right or when he is outdoors.  No prodromal illness, infection, cough, cold, change in diet or exercise. No traumas. In September an episode of left arm and left leg tingling sensation lasting for several hours. Patient had intermittent headaches and dizziness.  REVIEW OF SYSTEMS: Full 14 system review of systems performed and notable only for headache, night sweats, numbness.    ALLERGIES: No Known Allergies  HOME MEDICATIONS: Outpatient Encounter Prescriptions as of 04/14/2014  Medication Sig  . Vitamin D, Ergocalciferol, (DRISDOL) 50000 UNITS CAPS capsule Take 50,000 Units by mouth every 7 (seven) days.  . Dimethyl Fumarate (TECFIDERA) 240 MG CPDR Take 1 capsule (240 mg total) by mouth 2 (two) times daily.  Marland Kitchen ibuprofen (ADVIL,MOTRIN) 800 MG tablet Take 800 mg by mouth every 6 (six) hours as needed. for pain  . vitamin B-12 (CYANOCOBALAMIN) 100 MCG tablet Take 100 mcg by mouth daily.  . [DISCONTINUED] [employer] VITAMIN B12 1000 MCG tablet Take 1,000 mcg by mouth daily.    PAST MEDICAL HISTORY: Past Medical History  Diagnosis Date  . Multiple sclerosis    PAST SURGICAL HISTORY: History reviewed. No pertinent past surgical history.  FAMILY HISTORY: Family History  Problem Relation Age of Onset  . Congestive Heart Failure    . Depression    . Healthy Mother   . Healthy Father     SOCIAL HISTORY:  History   Social History  . Marital Status: Single    Spouse Name: N/A    Number of Children: 0  . Years of Education: BA   Occupational History  .  [employer]   Social History Main Topics  . Smoking status: Former  Smoker -- 0.50 packs/day for 12 years    Types: Cigarettes    Quit date: 03/15/2007  . Smokeless tobacco: Never Used  . Alcohol Use: Yes     Comment: 2 drinks weekly  . Drug Use: No  . Sexual Activity: Not on file   Other Topics Concern  . Not on file   Social History Narrative   Patient lives at home alone.   Caffeine Use: 1 cup daily     PHYSICAL EXAM  Filed Vitals:    04/14/14 1538  BP: 119/67  Pulse: 57  Height: 5\' 7"  (1.702 m)  Weight: 132 lb 9.6 oz (60.147 kg)    Not recorded      Body mass index is 20.76 kg/(m^2).  GENERAL EXAM: Patient is in no distress; well developed, nourished and groomed; neck is supple  CARDIOVASCULAR: Regular rate and rhythm, no murmurs, no carotid bruits  NEUROLOGIC: MENTAL STATUS: awake, alert, language fluent, comprehension intact, naming intact, fund of knowledge appropriate CRANIAL NERVE: no papilledema on fundoscopic exam, pupils equal and reactive to light, visual fields full to confrontation, extraocular muscles intact, no nystagmus, facial sensation and strength symmetric, hearing intact, palate elevates symmetrically, uvula midline, shoulder shrug symmetric, tongue midline. MOTOR: normal bulk and tone, full strength in the BUE, BLE SENSORY: normal and symmetric to light touch COORDINATION: finger-nose-finger, fine finger movements normal REFLEXES: deep tendon reflexes present and symmetric GAIT/STATION: narrow based gait; romberg is negative    DIAGNOSTIC DATA (LABS, IMAGING, TESTING) - I reviewed patient records, labs, notes, testing and imaging myself where available.  Lab Results  Component Value Date   WBC 8.5 08/29/2013   HGB 13.6 08/29/2013   HCT 41.6 08/29/2013   MCV 95 08/29/2013   PLT 250 08/29/2013      Component Value Date/Time   NA 140 02/19/2013 1127   NA 140 10/24/2010 2234   K 4.8 02/19/2013 1127   CL 100 02/19/2013 1127   CO2 24 02/19/2013 1127   GLUCOSE 81 02/19/2013 1127   GLUCOSE 137* 10/24/2010 2234   BUN 12 02/19/2013 1127   BUN 17 10/24/2010 2234   CREATININE 0.90 02/19/2013 1127   CALCIUM 10.5* 02/19/2013 1127   PROT 7.6 02/19/2013 1127   PROT 9.0* 10/24/2010 2234   ALBUMIN 5.6* 10/24/2010 2234   AST 24 02/19/2013 1127   ALT 20 02/19/2013 1127   ALKPHOS 78 02/19/2013 1127   BILITOT 0.4 02/19/2013 1127   GFRNONAA 110 02/19/2013 1127   GFRAA 127 02/19/2013 1127     No results found for: CHOL Lab Results  Component Value Date   HGBA1C 5.6 02/19/2013   Lab Results  Component Value Date   VITAMINB12 690 03/04/2013   Lab Results  Component Value Date   TSH 1.320 02/19/2013   I reviewed images myself and agree with interpretation. -VRP  02/28/13 MRI brain - Multiple (9 periventricular and subcortical, 2 pontine and 1 medullary ) T2 hyperintensities. The largest is in the left frontal periventricular region, which also has partial rim enhancement. Findings suspicious for acute and chronic demyelinating plaques. Other autoimmune, inflammatory or post-infectious etiologies are considerations.  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  - Right ponto-medullary T2 hyperintense lesion.  - Findings suspicious for chronic demyelinating disease. Other considerations include autoimmune, inflammatory, post-infectious or ischemic etiologies.  - No acute findings.  03/25/13 MRI thoracic - normal  03/04/13 Labs (B12, NMO ab, RPR, Hep B, Hep C, HIV, ANCA, ANA, Tb) - all negative/normal  03/04/13 JCV antibody - POSITIVE 1.95 (H)   ASSESSMENT AND PLAN  39 y.o. year old male here with sudden onset double vision in Nov/Dec 2014. MRI brain and c-spine consistent with multiple sclerosis. MS lab mimics were negative. Now on Tecfidera since 04/24/13, and doing well.  Dx: multiple sclerosis  PLAN: - continue tecfidera - check CBC, MRI brain (routine monitoring) - patient requesting lead level testing due to concern about his home (old, possible remote lead building materials)  Orders Placed This Encounter  Procedures  . MR Brain W Wo Contrast  . Lead, Blood  . CBC with Differential/Platelet   Return in about 3 months (around 07/13/2014).    Suanne Marker, MD 04/14/2014, 4:36 PM Certified  in Neurology, Neurophysiology and Neuroimaging  [facility] [address] [place], [place] [address] [phone]

## 2014-04-15 ENCOUNTER — Telehealth: Payer: Self-pay

## 2014-04-15 LAB — CBC WITH DIFFERENTIAL/PLATELET
Basophils Absolute: 0 10*3/uL (ref 0.0–0.2)
Basos: 1 %
EOS ABS: 0.2 10*3/uL (ref 0.0–0.4)
EOS: 3 %
HCT: 38.9 % (ref 37.5–51.0)
HEMOGLOBIN: 13.2 g/dL (ref 12.6–17.7)
Immature Grans (Abs): 0 10*3/uL (ref 0.0–0.1)
Immature Granulocytes: 0 %
LYMPHS ABS: 1.7 10*3/uL (ref 0.7–3.1)
Lymphs: 26 %
MCH: 31.3 pg (ref 26.6–33.0)
MCHC: 33.9 g/dL (ref 31.5–35.7)
MCV: 92 fL (ref 79–97)
MONOCYTES: 9 %
Monocytes Absolute: 0.6 10*3/uL (ref 0.1–0.9)
NEUTROS ABS: 4.1 10*3/uL (ref 1.4–7.0)
NEUTROS PCT: 61 %
PLATELETS: 264 10*3/uL (ref 150–379)
RBC: 4.22 x10E6/uL (ref 4.14–5.80)
RDW: 13.9 % (ref 12.3–15.4)
WBC: 6.6 10*3/uL (ref 3.4–10.8)

## 2014-04-15 LAB — LEAD, BLOOD: LEAD, BLOOD (ADULT): NOT DETECTED ug/dL (ref 0–19)

## 2014-04-15 NOTE — Telephone Encounter (Signed)
[employer] [facility] Plan has approved the request for coverage on Tecfidera effective until 04/15/2015 Ref # [id number]

## 2014-04-17 ENCOUNTER — Telehealth: Payer: Self-pay | Admitting: *Deleted

## 2014-04-17 NOTE — Telephone Encounter (Signed)
Entered in error

## 2014-04-17 NOTE — Telephone Encounter (Signed)
Spoke with pt. Explained to him that his labs were WNL. He stated a thanks and an understanding

## 2014-04-17 NOTE — Telephone Encounter (Signed)
Left a message for the pt to inform him that he recent lab work in office was WNL. I asked him to give me a call back.

## 2014-05-15 ENCOUNTER — Ambulatory Visit (INDEPENDENT_AMBULATORY_CARE_PROVIDER_SITE_OTHER): Payer: BC Managed Care – PPO

## 2014-05-15 DIAGNOSIS — G35 Multiple sclerosis: Secondary | ICD-10-CM

## 2014-05-15 MED ORDER — GADOPENTETATE DIMEGLUMINE 469.01 MG/ML IV SOLN: 12.0000 mL | Freq: Once | INTRAVENOUS | Status: AC | PRN

## 2014-07-06 ENCOUNTER — Other Ambulatory Visit: Payer: Self-pay | Admitting: Diagnostic Neuroimaging

## 2014-07-15 ENCOUNTER — Ambulatory Visit (INDEPENDENT_AMBULATORY_CARE_PROVIDER_SITE_OTHER): Payer: BC Managed Care – PPO | Admitting: Diagnostic Neuroimaging

## 2014-07-15 ENCOUNTER — Encounter: Payer: Self-pay | Admitting: Diagnostic Neuroimaging

## 2014-07-15 VITALS — BP 105/60 | HR 61 | Ht 67.0 in | Wt 135.6 lb

## 2014-07-15 DIAGNOSIS — G35 Multiple sclerosis: Secondary | ICD-10-CM

## 2014-07-15 NOTE — Progress Notes (Signed)
[facility]  PATIENT: Shane Rollins DOB: [birth year]  REFERRING CLINICIAN:  HISTORY FROM: patient REASON FOR VISIT: follow up   HISTORICAL  CHIEF COMPLAINT:  Chief Complaint  Patient presents with  . Follow-up    Multiple Sclerosis    HISTORY OF PRESENT ILLNESS:   UPDATE 07/15/14: Since last visit, doing well. No new events. Int facial numbness continues. Still with some left hand/wrist/finger joint pains.  UPDATE 04/14/14: Since last visit, has had some intermittent facial numbness, night sweats, joint pain. Also with increasing, nagging headaches. He notes that he skips lunch most days. He was drinking Etoh 1-2 drinks per night, but recently quit, to see if HA would improve, but thery haven't changed much.   UPDATE 08/29/13: Since last visit doing well. Tolerating tecfidera. 1 month ago had 1 week of night sweats, with sinus pressure and 1 day of fever. Some intermittent headaches with frontal sinus pressure. No nausea, vomiting, photphobia or phonophobia.   UPDATE 05/24/13: Since last visit, vision continue to improve. Now able to drive without eye patch. Some occ double vision with extreme right gaze. Also with intermittent, brief numbness in face, burning pains in left shoulder/scapula. Overall tolerating tecfidera (started 04/24/13), with no sig side effects. Taking meds with foods. Occ "quesey" stomach, but not nausea/vomiting/diarrhea.  UPDATE 04/10/13: Since last visit, right eye has been slightly improving. No new symptoms. Here to review test results, diagnosis, and treatment options.   UPDATE 03/04/13: Since last visit, symptoms stable. MRI results reviewed. No new events.  PRIOR HPI (02/19/13): 39 year old right-handed male here for evaluation of double vision. 2 weeks ago patient sudden onset double vision when looking to the right side. 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 Symptoms have been stable since that time. When  patient covers one eye he does not have double vision. Symptoms are worse when he looks to the right or when he is outdoors. No prodromal illness, infection, cough, cold, change in diet or exercise. No traumas. In September an episode of left arm and left leg tingling sensation lasting for several hours. Patient had intermittent headaches and dizziness.  REVIEW OF SYSTEMS: Full 14 system review of systems performed and notable only for numbness joint pain neck stiffness.     ALLERGIES: No Known Allergies  HOME MEDICATIONS: Outpatient Encounter Prescriptions as of 07/15/2014  . Order #: [id number] Class: Historical Med  . Order #: [id number] Class: Historical Med  . Order #: [id number] Class: Normal  . [DISCONTINUED] Order #: [id number] Class: Historical Med  . [DISCONTINUED] Order #: [id number] Class: Historical Med  . Order #: [id number] Class: Historical Med    PAST MEDICAL HISTORY: Past Medical History  Diagnosis Date  . Multiple sclerosis    PAST SURGICAL HISTORY: History reviewed. No pertinent past surgical history.  FAMILY HISTORY: Family History  Problem Relation Age of Onset  . Congestive Heart Failure    . Depression    . Healthy Mother   . Healthy Father     SOCIAL HISTORY:  History   Social History  . Marital Status: Single    Spouse Name: N/A  . Number of Children: 0  . Years of Education: BA   Occupational History  .  [employer]   Social History Main Topics  . Smoking status: Former Smoker -- 0.50 packs/day for 12 years    Types: Cigarettes    Quit date: 03/15/2007  . Smokeless tobacco: Never Used  .  Alcohol Use: Yes     Comment: 2 drinks weekly  . Drug Use: No  . Sexual Activity: Not on file   Other Topics Concern  . Not on file   Social History Narrative   Patient lives at home alone.   Caffeine Use: 1 cup daily     PHYSICAL EXAM  Filed Vitals:   07/15/14 1522  BP: 105/60  Pulse: 61  Height:  (1.702 m)  Weight: 135 lb  9.6 oz (61.508 kg)    Not recorded      Body mass index is 21.23 kg/(m^2).  GENERAL EXAM: Patient is in no distress; well developed, nourished and groomed; neck is supple  CARDIOVASCULAR: Regular rate and rhythm, no murmurs, no carotid bruits  NEUROLOGIC: MENTAL STATUS: awake, alert, language fluent, comprehension intact, naming intact, fund of knowledge appropriate CRANIAL NERVE: pupils equal and reactive to light, visual fields full to confrontation, extraocular muscles intact, no nystagmus, facial sensation and strength symmetric, hearing intact, palate elevates symmetrically, uvula midline, shoulder shrug symmetric, tongue midline. MOTOR: normal bulk and tone, full strength in the BUE, BLE SENSORY: normal and symmetric to light touch COORDINATION: finger-nose-finger, fine finger movements normal REFLEXES: deep tendon reflexes present and symmetric GAIT/STATION: narrow based gait; romberg is negative    DIAGNOSTIC DATA (LABS, IMAGING, TESTING) - I reviewed patient records, labs, notes, testing and imaging myself where available.  Lab Results  Component Value Date   WBC 6.6 04/14/2014   HGB 13.2 04/14/2014   HCT 38.9 04/14/2014   MCV 92 04/14/2014   PLT 264 04/14/2014      Component Value Date/Time   NA 140 02/19/2013 1127   NA 140 10/24/2010 2234   K 4.8 02/19/2013 1127   CL 100 02/19/2013 1127   CO2 24 02/19/2013 1127   GLUCOSE 81 02/19/2013 1127   GLUCOSE 137* 10/24/2010 2234   BUN 12 02/19/2013 1127   BUN 17 10/24/2010 2234   CREATININE 0.90 02/19/2013 1127   CALCIUM 10.5* 02/19/2013 1127   PROT 7.6 02/19/2013 1127   PROT 9.0* 10/24/2010 2234   ALBUMIN 5.6* 10/24/2010 2234   AST 24 02/19/2013 1127   ALT 20 02/19/2013 1127   ALKPHOS 78 02/19/2013 1127   BILITOT 0.4 02/19/2013 1127   GFRNONAA 110 02/19/2013 1127   GFRAA 127 02/19/2013 1127   No results found for: CHOL Lab Results  Component Value Date   HGBA1C 5.6 02/19/2013   Lab Results    Component Value Date   VITAMINB12 690 03/04/2013   Lab Results  Component Value Date   TSH 1.320 02/19/2013   VIT D, 25-HYDROXY  Date Value Ref Range Status  08/29/2013 45.8 30.0 - 100.0 ng/mL Final    Comment:    Vitamin D deficiency has been defined by the Institute of Medicine and an Endocrine Society practice guideline as a level of serum 25-OH vitamin D less than 20 ng/mL (1,2). The Endocrine Society went on to further define vitamin D insufficiency as a level between 21 and 29 ng/mL (2). 1. IOM (Institute of Medicine). 2010. Dietary reference    intakes for calcium and D. [place]: The    [employer]. 2. Holick MF, Binkley [place], Bischoff-Ferrari HA, et al.    Evaluation, treatment, and prevention of vitamin D    deficiency: an Endocrine Society clinical practice    guideline. JCEM. 2011 Jul; 96(7):1911-30.  05/24/2013 12.9* 30.0 - 100.0 ng/mL Final    Comment:    Vitamin D deficiency  has been defined by the Institute of Medicine and an Endocrine Society practice guideline as a level of serum 25-OH vitamin D less than 20 ng/mL (1,2). The Endocrine Society went on to further define vitamin D insufficiency as a level between 21 and 29 ng/mL (2). 1. IOM (Institute of Medicine). 2010. Dietary reference    intakes for calcium and D. [place]: The    [employer]. 2. Holick MF, Binkley [place], Bischoff-Ferrari HA, et al.    Evaluation, treatment, and prevention of vitamin D    deficiency: an Endocrine Society clinical practice    guideline. JCEM. 2011 Jul; 96(7):1911-30.    03/25/13 MRI cervical  - Right anterior T2 hyperintense spinal cord lesion from C2 to C3-4 level, measuring 3.0cm in cranio-caudal extent, and 0.4cm in antero-posterior extent.  - Right ponto-medullary T2 hyperintense lesion.  - Findings suspicious for chronic demyelinating disease. Other considerations include autoimmune, inflammatory, post-infectious or ischemic etiologies.   - No acute findings.  03/25/13 MRI thoracic - normal  05/15/14 MRI brain -  1. There are multiple periventricular, subcortical, juxtacortical, midbrain, pontine chronic demyelinating plaques. No acute plaques.  2. Compared to MRI on 09/04/13, there is no significant change.  03/04/13 Labs (B12, NMO ab, RPR, Hep B, Hep C, HIV, ANCA, ANA, Tb) - all negative/normal  03/04/13 JCV antibody - POSITIVE 1.95 (H)   ASSESSMENT AND PLAN  39 y.o. year old male here with sudden onset double vision in Nov/Dec 2014. MRI brain and c-spine consistent with multiple sclerosis. MS lab mimics were negative. Now on Tecfidera since 04/24/13, and doing well.  Dx: multiple sclerosis  PLAN: - continue tecfidera, multi-vitamin, vitamin D 1000 units daily - monitor joint pains; conservative mgmt with NSAIDs and icing; in future could consider xray of left hand, uric acid testing and rheumatology evaluation  Return in about 6 months (around 01/15/2015).    Suanne Marker, MD 07/15/2014, 3:47 PM Certified in Neurology, Neurophysiology and Neuroimaging  [facility] [address], [address] [place], [place] [address] [phone]

## 2014-07-15 NOTE — Patient Instructions (Signed)
Continue tecfidera, multivitamin, vitamin D 1000 units daily.

## 2015-01-19 ENCOUNTER — Ambulatory Visit: Payer: BC Managed Care – PPO | Admitting: Diagnostic Neuroimaging

## 2015-02-18 ENCOUNTER — Other Ambulatory Visit: Payer: Self-pay | Admitting: Diagnostic Neuroimaging

## 2015-02-18 NOTE — Telephone Encounter (Signed)
Lisa/ [facility] phone [phone] called needs RX for Tecfidera.

## 2015-02-18 NOTE — Telephone Encounter (Signed)
I called [facility]. Spoke with Arlys John.  He said they are taking over Tecfidera Rx from patient's old pharmacy and needed to verify order.  I verified Rx with him.  They will process Rx as prescribed.  Says Rx will be sent through the [place]. [place] location. I have added this pharmacy to the chart for future fills.

## 2015-03-02 ENCOUNTER — Ambulatory Visit: Payer: BC Managed Care – PPO | Admitting: Diagnostic Neuroimaging

## 2015-03-21 ENCOUNTER — Telehealth: Payer: Self-pay

## 2015-03-21 NOTE — Telephone Encounter (Signed)
[facility] Plan has approved the request for coverage on Tecfidera effective until 03/11/2017, or until policy changes or is terminated Ref PA# [facility] [id number] DR  Ref ID: [id number]

## 2015-04-21 ENCOUNTER — Telehealth: Payer: Self-pay | Admitting: Diagnostic Neuroimaging

## 2015-04-21 ENCOUNTER — Encounter: Payer: Self-pay | Admitting: *Deleted

## 2015-04-28 ENCOUNTER — Other Ambulatory Visit: Payer: Self-pay | Admitting: Diagnostic Neuroimaging

## 2015-04-28 NOTE — Telephone Encounter (Signed)
LVM requesting patient call back to discuss need for him to follow up with Dr Marjory Lies and get medication refilled. Left this caller's name, number.

## 2015-05-05 ENCOUNTER — Telehealth: Payer: Self-pay | Admitting: Diagnostic Neuroimaging

## 2015-05-05 NOTE — Telephone Encounter (Signed)
Spoke with Gretchen Short tech[facility] who stated their system did not show any refills for tecfidera. He sent refill request electronically.

## 2015-05-05 NOTE — Telephone Encounter (Signed)
Kim with [facility] [phone] needing verbal auth for tecfidera. She said when she hits refill button it shows he is out. [facility] said [facility].

## 2015-05-06 ENCOUNTER — Other Ambulatory Visit: Payer: Self-pay

## 2015-05-06 MED ORDER — DIMETHYL FUMARATE 240 MG PO CPDR: DELAYED_RELEASE_CAPSULE | ORAL | Status: DC

## 2015-05-06 NOTE — Telephone Encounter (Signed)
If patient calls back he can schedule appt with Dr.Penumalli or one of the NP. His last office visit was 07/2014. Patient was given refills till June 2017. LFt vm for patient.

## 2015-05-18 ENCOUNTER — Encounter: Payer: Self-pay | Admitting: *Deleted

## 2015-05-18 NOTE — Telephone Encounter (Signed)
Letter mailed to patient requesting he call and schedule FU. Also informed him medication refills sent only through June 2017.

## 2015-06-18 DIAGNOSIS — Z0289 Encounter for other administrative examinations: Secondary | ICD-10-CM

## 2015-07-30 ENCOUNTER — Encounter: Payer: Self-pay | Admitting: Diagnostic Neuroimaging

## 2015-07-30 ENCOUNTER — Ambulatory Visit (INDEPENDENT_AMBULATORY_CARE_PROVIDER_SITE_OTHER): Payer: BC Managed Care – PPO | Admitting: Diagnostic Neuroimaging

## 2015-07-30 VITALS — BP 108/60 | HR 54 | Ht 67.0 in | Wt 132.6 lb

## 2015-07-30 DIAGNOSIS — R200 Anesthesia of skin: Secondary | ICD-10-CM | POA: Diagnosis not present

## 2015-07-30 DIAGNOSIS — G35 Multiple sclerosis: Secondary | ICD-10-CM

## 2015-07-30 NOTE — Patient Instructions (Signed)
-   continue tecfidera - I will check labs today

## 2015-07-30 NOTE — Progress Notes (Signed)
[facility]  PATIENT: Shane Rollins DOB: June 09, 1975  REFERRING CLINICIAN:  HISTORY FROM: patient REASON FOR VISIT: follow up   HISTORICAL  CHIEF COMPLAINT:  Chief Complaint  Patient presents with  . Multiple Sclerosis    rm 6, Tecfidera, 05/2014 MRI brain, "tingling of face, forearms, numbness; balance feels a little shaky at times; fatigue- no longer taking B12; pharmacist said to watch liver function"  . Follow-up    1 year    HISTORY OF PRESENT ILLNESS:   UPDATE 07/30/15: Since last visit, doing well. No new major issues. Some intermittent numbness. Some intermittent urinary frequency issues.  UPDATE 07/15/14: Since last visit, doing well. No new events. Int facial numbness continues. Still with some left hand/wrist/finger joint pains.  UPDATE 04/14/14: Since last visit, has had some intermittent facial numbness, night sweats, joint pain. Also with increasing, nagging headaches. He notes that he skips lunch most days. He was drinking Etoh 1-2 drinks per night, but recently quit, to see if HA would improve, but thery haven't changed much.   UPDATE 08/29/13: Since last visit doing well. Tolerating tecfidera. 1 month ago had 1 week of night sweats, with sinus pressure and 1 day of fever. Some intermittent headaches with frontal sinus pressure. No nausea, vomiting, photphobia or phonophobia.   UPDATE 05/24/13: Since last visit, vision continue to improve. Now able to drive without eye patch. Some occ double vision with extreme right gaze. Also with intermittent, brief numbness in face, burning pains in left shoulder/scapula. Overall tolerating tecfidera (started 04/24/13), with no sig side effects. Taking meds with foods. Occ "quesey" stomach, but not nausea/vomiting/diarrhea.  UPDATE 04/10/13: Since last visit, right eye has been slightly improving. No new symptoms. Here to review test results, diagnosis, and treatment options.   UPDATE 03/04/13: Since last visit,  symptoms stable. MRI results reviewed. No new events.  PRIOR HPI (02/19/13): 40 year old right-handed male here for evaluation of double vision. 2 weeks ago patient sudden onset double vision when looking to the right side. 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 Symptoms have been stable since that time. When patient covers one eye he does not have double vision. Symptoms are worse when he looks to the right or when he is outdoors. No prodromal illness, infection, cough, cold, change in diet or exercise. No traumas. In September an episode of left arm and left leg tingling sensation lasting for several hours. Patient had intermittent headaches and dizziness.  REVIEW OF SYSTEMS: Full 14 system review of systems performed and notable only for numbness joint pain neck stiffness.     ALLERGIES: No Known Allergies  HOME MEDICATIONS: Outpatient Encounter Prescriptions as of 07/30/2015  Medication Sig  . Dimethyl Fumarate (TECFIDERA) 240 MG CPDR TAKE 1 CAPSULE (240 MG) BY MOUTH TWICE DAILY  . ibuprofen (ADVIL,MOTRIN) 800 MG tablet Take 800 mg by mouth every 6 (six) hours as needed. for pain  . Multiple Vitamin (MULTIVITAMIN) tablet Take 1 tablet by mouth daily.  . [DISCONTINUED] cholecalciferol (VITAMIN D) 1000 UNITS tablet Take 1,000 Units by mouth daily.   No facility-administered encounter medications on file as of 07/30/2015.    PAST MEDICAL HISTORY: Past Medical History  Diagnosis Date  . Multiple sclerosis (HCC)    PAST SURGICAL HISTORY: History reviewed. No pertinent past surgical history.  FAMILY HISTORY: Family History  Problem Relation Age of Onset  . Congestive Heart Failure    . Depression    . Healthy  Mother   . Healthy Father     SOCIAL HISTORY:  Social History   Social History  . Marital Status: Single    Spouse Name: N/A  . Number of Children: 0  . Years of Education: BA   Occupational History  .  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 Smoking status: Former Smoker -- 0.50 packs/day for 12 years    Types: Cigarettes    Quit date: 03/15/2007  . Smokeless tobacco: Never Used  . Alcohol Use: Yes     Comment: 2 drinks weekly  . Drug Use: No  . Sexual Activity: Not on file   Other Topics Concern  . Not on file   Social History Narrative   Patient lives at home alone.   Caffeine Use: 1 cup daily     PHYSICAL EXAM  Filed Vitals:   07/30/15 1552  BP: 108/60  Pulse: 54  Height: 5\' 7"  (1.702 m)  Weight: 132 lb 9.6 oz (60.147 kg)    Not recorded      Body mass index is 20.76 kg/(m^2).  GENERAL EXAM: Patient is in no distress; well developed, nourished and groomed; neck is supple  CARDIOVASCULAR: Regular rate and rhythm, no murmurs, no carotid bruits  NEUROLOGIC: MENTAL STATUS: awake, alert, language fluent, comprehension intact, naming intact, fund of knowledge appropriate CRANIAL NERVE: pupils equal and reactive to light, visual fields full to confrontation, extraocular muscles intact, no nystagmus, facial sensation and strength symmetric, hearing intact, palate elevates symmetrically, uvula midline, shoulder shrug symmetric, tongue midline. MOTOR: normal bulk and tone, full strength in the BUE, BLE SENSORY: normal and symmetric to light touch COORDINATION: finger-nose-finger, fine finger movements normal REFLEXES: deep tendon reflexes present and symmetric GAIT/STATION: narrow based gait; romberg is negative; TANDEM STABLE    DIAGNOSTIC DATA (LABS, IMAGING, TESTING) - I reviewed patient records, labs, notes, testing and imaging myself where available.  Lab Results  Component Value Date   WBC 6.6 04/14/2014   HGB 13.2 04/14/2014   HCT 38.9 04/14/2014   MCV 92 04/14/2014   PLT 264 04/14/2014      Component Value Date/Time   NA 140 02/19/2013 1127   NA 140 10/24/2010 2234   K 4.8 02/19/2013 1127   CL 100 02/19/2013 1127   CO2 24 02/19/2013 1127    GLUCOSE 81 02/19/2013 1127   GLUCOSE 137* 10/24/2010 2234   BUN 12 02/19/2013 1127   BUN 17 10/24/2010 2234   CREATININE 0.90 02/19/2013 1127   CALCIUM 10.5* 02/19/2013 1127   PROT 7.6 02/19/2013 1127   PROT 9.0* 10/24/2010 2234   ALBUMIN 5.1 02/19/2013 1127   ALBUMIN 5.6* 10/24/2010 2234   AST 24 02/19/2013 1127   ALT 20 02/19/2013 1127   ALKPHOS 78 02/19/2013 1127   BILITOT 0.4 02/19/2013 1127   GFRNONAA 110 02/19/2013 1127   GFRAA 127 02/19/2013 1127   No results found for: CHOL Lab Results  Component Value Date   HGBA1C 5.6 02/19/2013   Lab Results  Component Value Date   VITAMINB12 690 03/04/2013   Lab Results  Component Value Date   TSH 1.320 02/19/2013   VIT D, 25-HYDROXY  Date Value Ref Range Status  08/29/2013 45.8 30.0 - 100.0 ng/mL Final    Comment:    Vitamin D deficiency has been defined by the Institute of Medicine and an Endocrine Society practice guideline as a level of serum 25-OH vitamin D less than  20 ng/mL (1,2). The Endocrine Society went on to further define vitamin D insufficiency as a level between 21 and 29 ng/mL (2). 1. IOM (Institute of Medicine). 2010. Dietary reference    intakes for calcium and D. [place]: The    [employer]. 2. Holick MF, Binkley [place], Bischoff-Ferrari HA, et al.    Evaluation, treatment, and prevention of vitamin D    deficiency: an Endocrine Society clinical practice    guideline. JCEM. 2011 Jul; 96(7):1911-30.  05/24/2013 12.9* 30.0 - 100.0 ng/mL Final    Comment:    Vitamin D deficiency has been defined by the Institute of Medicine and an Endocrine Society practice guideline as a level of serum 25-OH vitamin D less than 20 ng/mL (1,2). The Endocrine Society went on to further define vitamin D insufficiency as a level between 21 and 29 ng/mL (2). 1. IOM (Institute of Medicine). 2010. Dietary reference    intakes for calcium and D. [place]: The    [employer]. 2. Holick MF,  Binkley [place], Bischoff-Ferrari HA, et al.    Evaluation, treatment, and prevention of vitamin D    deficiency: an Endocrine Society clinical practice    guideline. JCEM. 2011 Jul; 96(7):1911-30.    03/25/13 MRI cervical  - Right anterior T2 hyperintense spinal cord lesion from C2 to C3-4 level, measuring 3.0cm in cranio-caudal extent, and 0.4cm in antero-posterior extent.  - Right ponto-medullary T2 hyperintense lesion.  - Findings suspicious for chronic demyelinating disease. Other considerations include autoimmune, inflammatory, post-infectious or ischemic etiologies.  - No acute findings.  03/25/13 MRI thoracic - normal  05/15/14 MRI brain -  1. There are multiple periventricular, subcortical, juxtacortical, midbrain, pontine chronic demyelinating plaques. No acute plaques.  2. Compared to MRI on 09/04/13, there is no significant change.  03/04/13 Labs (B12, NMO ab, RPR, Hep B, Hep C, HIV, ANCA, ANA, Tb) - all negative/normal  03/04/13 JCV antibody - POSITIVE 1.95 (H)   ASSESSMENT AND PLAN  40 y.o. year old male here with sudden onset double vision in Nov/Dec 2014. MRI brain and c-spine consistent with multiple sclerosis. MS lab mimics were negative. Now on Tecfidera since 04/24/13, and doing well.  Dx: multiple sclerosis  Multiple sclerosis (HCC)  Numbness    PLAN: - continue tecfidera - continue multi-vitamin, vitamin D 1000 units daily - check labs - monitor symptoms; if significant worsening, then will check MRI brain  Orders Placed This Encounter  Procedures  . CBC with Differential/Platelet  . Comprehensive metabolic panel  . VITAMIN D 25 Hydroxy (Vit-D Deficiency, Fractures)  . Vitamin B12   Return in about 3 months (around 10/30/2015).    Suanne Marker, MD 07/30/2015, 4:34 PM Certified in Neurology, Neurophysiology and Neuroimaging  [facility] [address], [address] [place], [place] [address] [phone]

## 2015-07-31 ENCOUNTER — Telehealth: Payer: Self-pay | Admitting: *Deleted

## 2015-07-31 LAB — CBC WITH DIFFERENTIAL/PLATELET
BASOS: 1 %
Basophils Absolute: 0 10*3/uL (ref 0.0–0.2)
EOS (ABSOLUTE): 0.1 10*3/uL (ref 0.0–0.4)
Eos: 2 %
Hematocrit: 40.2 % (ref 37.5–51.0)
Hemoglobin: 13.2 g/dL (ref 12.6–17.7)
IMMATURE GRANS (ABS): 0 10*3/uL (ref 0.0–0.1)
IMMATURE GRANULOCYTES: 0 %
Lymphocytes Absolute: 1.7 10*3/uL (ref 0.7–3.1)
Lymphs: 28 %
MCH: 31.2 pg (ref 26.6–33.0)
MCHC: 32.8 g/dL (ref 31.5–35.7)
MCV: 95 fL (ref 79–97)
MONOS ABS: 0.6 10*3/uL (ref 0.1–0.9)
Monocytes: 10 %
NEUTROS PCT: 59 %
Neutrophils Absolute: 3.6 10*3/uL (ref 1.4–7.0)
Platelets: 259 10*3/uL (ref 150–379)
RBC: 4.23 x10E6/uL (ref 4.14–5.80)
RDW: 13.3 % (ref 12.3–15.4)
WBC: 6 10*3/uL (ref 3.4–10.8)

## 2015-07-31 LAB — COMPREHENSIVE METABOLIC PANEL
A/G RATIO: 2 (ref 1.2–2.2)
ALT: 15 IU/L (ref 0–44)
AST: 24 IU/L (ref 0–40)
Albumin: 4.9 g/dL (ref 3.5–5.5)
Alkaline Phosphatase: 84 IU/L (ref 39–117)
BUN/Creatinine Ratio: 10 (ref 9–20)
BUN: 7 mg/dL (ref 6–20)
Bilirubin Total: 0.5 mg/dL (ref 0.0–1.2)
CALCIUM: 9.5 mg/dL (ref 8.7–10.2)
CO2: 21 mmol/L (ref 18–29)
Chloride: 102 mmol/L (ref 96–106)
Creatinine, Ser: 0.73 mg/dL — ABNORMAL LOW (ref 0.76–1.27)
GFR, EST AFRICAN AMERICAN: 137 mL/min/{1.73_m2} (ref >59)
GFR, EST NON AFRICAN AMERICAN: 118 mL/min/{1.73_m2} (ref >59)
Globulin, Total: 2.5 g/dL (ref 1.5–4.5)
Glucose: 83 mg/dL (ref 65–99)
POTASSIUM: 4 mmol/L (ref 3.5–5.2)
SODIUM: 142 mmol/L (ref 134–144)
TOTAL PROTEIN: 7.4 g/dL (ref 6.0–8.5)

## 2015-07-31 LAB — VITAMIN D 25 HYDROXY (VIT D DEFICIENCY, FRACTURES): Vit D, 25-Hydroxy: 27 ng/mL — ABNORMAL LOW (ref 30.0–100.0)

## 2015-07-31 LAB — VITAMIN B12: Vitamin B-12: 357 pg/mL (ref 211–946)

## 2015-07-31 NOTE — Telephone Encounter (Signed)
Spoke with patient and per Dr Marjory Lies, informed him his labs are unremarkable, Vit B12 level is good, borderline low Vit D. Advised he take MVI daily and try to get some limited sunshine several days a week. 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  He verbalized understanding, agreement.

## 2015-09-21 ENCOUNTER — Other Ambulatory Visit: Payer: Self-pay | Admitting: Diagnostic Neuroimaging

## 2015-11-03 ENCOUNTER — Ambulatory Visit: Payer: BC Managed Care – PPO | Admitting: Diagnostic Neuroimaging

## 2015-11-23 ENCOUNTER — Other Ambulatory Visit: Payer: Self-pay | Admitting: Diagnostic Neuroimaging

## 2015-11-30 ENCOUNTER — Encounter: Payer: Self-pay | Admitting: Diagnostic Neuroimaging

## 2015-11-30 ENCOUNTER — Ambulatory Visit (INDEPENDENT_AMBULATORY_CARE_PROVIDER_SITE_OTHER): Payer: BC Managed Care – PPO | Admitting: Diagnostic Neuroimaging

## 2015-11-30 VITALS — BP 120/66 | HR 60 | Wt 133.4 lb

## 2015-11-30 DIAGNOSIS — G35 Multiple sclerosis: Secondary | ICD-10-CM

## 2015-11-30 DIAGNOSIS — R200 Anesthesia of skin: Secondary | ICD-10-CM | POA: Diagnosis not present

## 2015-11-30 DIAGNOSIS — R3915 Urgency of urination: Secondary | ICD-10-CM

## 2015-11-30 NOTE — Progress Notes (Signed)
[facility]  PATIENT: Shane Rollins DOB: 10-09-1975  REFERRING CLINICIAN:  HISTORY FROM: patient REASON FOR VISIT: follow up   HISTORICAL  CHIEF COMPLAINT:  Chief Complaint  Patient presents with  . Multiple Sclerosis    rm 6, Tecfidera, 05/2014 MRI brain, "urinary freq/urgency ~ 1 yr; occas eye twitch-goes away"  . Follow-up    3 month    HISTORY OF PRESENT ILLNESS:   UPDATE 11/30/15: Since last visit, doing about the same. Some intermittent urinary frequency/urgency and incomplete bladder emptying sensation. No pain or dysuria.   UPDATE 07/30/15: Since last visit, doing well. No new major issues. Some intermittent numbness. Some intermittent urinary frequency issues.  UPDATE 07/15/14: Since last visit, doing well. No new events. Int facial numbness continues. Still with some left hand/wrist/finger joint pains.  UPDATE 04/14/14: Since last visit, has had some intermittent facial numbness, night sweats, joint pain. Also with increasing, nagging headaches. He notes that he skips lunch most days. He was drinking Etoh 1-2 drinks per night, but recently quit, to see if HA would improve, but thery haven't changed much.   UPDATE 08/29/13: Since last visit doing well. Tolerating tecfidera. 1 month ago had 1 week of night sweats, with sinus pressure and 1 day of fever. Some intermittent headaches with frontal sinus pressure. No nausea, vomiting, photphobia or phonophobia.   UPDATE 05/24/13: Since last visit, vision continue to improve. Now able to drive without eye patch. Some occ double vision with extreme right gaze. Also with intermittent, brief numbness in face, burning pains in left shoulder/scapula. Overall tolerating tecfidera (started 04/24/13), with no sig side effects. Taking meds with foods. Occ "quesey" stomach, but not nausea/vomiting/diarrhea.  UPDATE 04/10/13: Since last visit, right eye has been slightly improving. No new symptoms. Here to review test results,  diagnosis, and treatment options.   UPDATE 03/04/13: Since last visit, symptoms stable. MRI results reviewed. No new events.  PRIOR HPI (02/19/13): 40 year old right-handed male here for evaluation of double vision. 2 weeks ago patient sudden onset double vision when looking to the right side. 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 Symptoms have been stable since that time. When patient covers one eye he does not have double vision. Symptoms are worse when he looks to the right or when he is outdoors. No prodromal illness, infection, cough, cold, change in diet or exercise. No traumas. In September an episode of left arm and left leg tingling sensation lasting for several hours. Patient had intermittent headaches and dizziness.   REVIEW OF SYSTEMS: Full 14 system review of systems performed and notable only for numbness joint pain neck stiffness.     ALLERGIES: Allergies  Allergen Reactions  . Codeine Nausea And Vomiting    HOME MEDICATIONS: Outpatient Encounter Prescriptions as of 11/30/2015  Medication Sig  . ibuprofen (ADVIL,MOTRIN) 800 MG tablet Take 800 mg by mouth every 6 (six) hours as needed. for pain  . Multiple Vitamin (MULTIVITAMIN) tablet Take 1 tablet by mouth daily.  . TECFIDERA 240 MG CPDR TAKE 1 CAPSULE (240MG ) BY MOUTH TWICE DAILY. ([place])   No facility-administered encounter medications on file as of 11/30/2015.     PAST MEDICAL HISTORY: Past Medical History:  Diagnosis Date  . Multiple sclerosis (HCC)    PAST SURGICAL HISTORY: History reviewed. No pertinent surgical history.  FAMILY HISTORY: Family History  Problem Relation Age of Onset  . Healthy Mother   . Healthy Father   .  Congestive Heart Failure    . Depression      SOCIAL HISTORY:  Social History   Social History  . Marital status: Single    Spouse name: N/A  . Number of children: 0  . Years of education: BA   Occupational History  .  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 Smoking status: Former Smoker    Packs/day: 0.50    Years: 12.00    Types: Cigarettes    Quit date: 03/15/2007  . Smokeless tobacco: Never Used  . Alcohol use Yes     Comment: 2 drinks weekly  . Drug use: No  . Sexual activity: Not on file   Other Topics Concern  . Not on file   Social History Narrative   Patient lives at home alone.   Caffeine Use: 1 cup daily     PHYSICAL EXAM  Vitals:   11/30/15 1529  BP: 120/66  Pulse: 60  Weight: 133 lb 6.4 oz (60.5 kg)    Not recorded      Body mass index is 20.89 kg/m.  GENERAL EXAM: Patient is in no distress; well developed, nourished and groomed; neck is supple  CARDIOVASCULAR: Regular rate and rhythm, no murmurs, no carotid bruits  NEUROLOGIC: MENTAL STATUS: awake, alert, language fluent, comprehension intact, naming intact, fund of knowledge appropriate CRANIAL NERVE: pupils equal and reactive to light, visual fields full to confrontation, extraocular muscles intact, no nystagmus, facial sensation and strength symmetric, hearing intact, palate elevates symmetrically, uvula midline, shoulder shrug symmetric, tongue midline. MOTOR: normal bulk and tone, full strength in the BUE, BLE SENSORY: normal and symmetric to light touch COORDINATION: finger-nose-finger, fine finger movements normal REFLEXES: deep tendon reflexes present and symmetric GAIT/STATION: narrow based gait; romberg is negative; TANDEM STABLE    DIAGNOSTIC DATA (LABS, IMAGING, TESTING) - I reviewed patient records, labs, notes, testing and imaging myself where available.  Lab Results  Component Value Date   WBC 6.0 07/30/2015   HGB 13.2 04/14/2014   HCT 40.2 07/30/2015   MCV 95 07/30/2015   PLT 259 07/30/2015      Component Value Date/Time   NA 142 07/30/2015 1703   K 4.0 07/30/2015 1703   CL 102 07/30/2015 1703   CO2 21 07/30/2015 1703   GLUCOSE 83 07/30/2015 1703   GLUCOSE 137 (H)  10/24/2010 2234   BUN 7 07/30/2015 1703   CREATININE 0.73 (L) 07/30/2015 1703   CALCIUM 9.5 07/30/2015 1703   PROT 7.4 07/30/2015 1703   ALBUMIN 4.9 07/30/2015 1703   AST 24 07/30/2015 1703   ALT 15 07/30/2015 1703   ALKPHOS 84 07/30/2015 1703   BILITOT 0.5 07/30/2015 1703   GFRNONAA 118 07/30/2015 1703   GFRAA 137 07/30/2015 1703   No results found for: CHOL Lab Results  Component Value Date   HGBA1C 5.6 02/19/2013   Lab Results  Component Value Date   VITAMINB12 357 07/30/2015   Lab Results  Component Value Date   TSH 1.320 02/19/2013   Vit D, 25-Hydroxy  Date Value Ref Range Status  07/30/2015 27.0 (L) 30.0 - 100.0 ng/mL Final    Comment:    Vitamin D deficiency has been defined by the Institute of Medicine and an Endocrine Society practice guideline as a level of serum 25-OH vitamin D less than 20 ng/mL (1,2). The Endocrine Society went on to further define vitamin D insufficiency as a level between 21 and 29  ng/mL (2). 1. IOM (Institute of Medicine). 2010. Dietary reference    intakes for calcium and D. [place]: The    [employer]. 2. Holick MF, Binkley [place], Bischoff-Ferrari HA, et al.    Evaluation, treatment, and prevention of vitamin D    deficiency: an Endocrine Society clinical practice    guideline. JCEM. 2011 Jul; 96(7):1911-30.   08/29/2013 45.8 30.0 - 100.0 ng/mL Final    Comment:    Vitamin D deficiency has been defined by the Institute of Medicine and an Endocrine Society practice guideline as a level of serum 25-OH vitamin D less than 20 ng/mL (1,2). The Endocrine Society went on to further define vitamin D insufficiency as a level between 21 and 29 ng/mL (2). 1. IOM (Institute of Medicine). 2010. Dietary reference    intakes for calcium and D. [place]: The    [employer]. 2. Holick MF, Binkley [place], Bischoff-Ferrari HA, et al.    Evaluation, treatment, and prevention of vitamin D    deficiency: an Endocrine  Society clinical practice    guideline. JCEM. 2011 Jul; 96(7):1911-30.  05/24/2013 12.9 (L) 30.0 - 100.0 ng/mL Final    Comment:    Vitamin D deficiency has been defined by the Institute of Medicine and an Endocrine Society practice guideline as a level of serum 25-OH vitamin D less than 20 ng/mL (1,2). The Endocrine Society went on to further define vitamin D insufficiency as a level between 21 and 29 ng/mL (2). 1. IOM (Institute of Medicine). 2010. Dietary reference    intakes for calcium and D. [place]: The    [employer]. 2. Holick MF, Binkley [place], Bischoff-Ferrari HA, et al.    Evaluation, treatment, and prevention of vitamin D    deficiency: an Endocrine Society clinical practice    guideline. JCEM. 2011 Jul; 96(7):1911-30.    03/25/13 MRI cervical  - Right anterior T2 hyperintense spinal cord lesion from C2 to C3-4 level, measuring 3.0cm in cranio-caudal extent, and 0.4cm in antero-posterior extent.  - Right ponto-medullary T2 hyperintense lesion.  - Findings suspicious for chronic demyelinating disease. Other considerations include autoimmune, inflammatory, post-infectious or ischemic etiologies.  - No acute findings.  03/25/13 MRI thoracic - normal  05/15/14 MRI brain -  1. There are multiple periventricular, subcortical, juxtacortical, midbrain, pontine chronic demyelinating plaques. No acute plaques.  2. Compared to MRI on 09/04/13, there is no significant change.  03/04/13 Labs (B12, NMO ab, RPR, Hep B, Hep C, HIV, ANCA, ANA, Tb) - all negative/normal  03/04/13 JCV antibody - POSITIVE 1.95 (H)   ASSESSMENT AND PLAN  40 y.o. year old male here with sudden onset double vision in Nov/Dec 2014. MRI brain and c-spine consistent with multiple sclerosis. MS lab mimics were negative. Now on Tecfidera since 04/24/13, and doing well.  Dx: multiple sclerosis  No diagnosis found.    PLAN: - continue tecfidera - continue multi-vitamin, vitamin D 1000 units  daily - check labs - monitor symptoms; if significant worsening, then will check MRI brain  No orders of the defined types were placed in this encounter.  No Follow-up on file.    Suanne Marker, MD 11/30/2015, 4:10 PM Certified in Neurology, Neurophysiology and Neuroimaging  [facility] [address] [place], [place] [address] [phone]

## 2015-11-30 NOTE — Patient Instructions (Signed)
-  continue current medications

## 2016-07-04 ENCOUNTER — Telehealth: Payer: Self-pay | Admitting: *Deleted

## 2016-07-04 NOTE — Telephone Encounter (Signed)
This pts medication, Tecfidera was shipped 06-28-16.  (240mg  po caps)

## 2016-10-05 ENCOUNTER — Telehealth: Payer: Self-pay | Admitting: *Deleted

## 2016-10-05 NOTE — Telephone Encounter (Signed)
[facility] shipped out tecfidera on 09-30-16.

## 2016-11-18 ENCOUNTER — Other Ambulatory Visit: Payer: Self-pay | Admitting: Diagnostic Neuroimaging

## 2017-02-28 ENCOUNTER — Telehealth: Payer: Self-pay | Admitting: *Deleted

## 2017-02-28 NOTE — Telephone Encounter (Signed)
Received fax from [facility]: Tecfidera approved 02/28/17 through 03/01/19.

## 2017-06-12 ENCOUNTER — Telehealth: Payer: Self-pay | Admitting: *Deleted

## 2017-06-12 NOTE — Telephone Encounter (Signed)
Received fax from [facility]: Tecfidera DR 240 mg caps shipped to patient on 06/01/17.

## 2017-07-05 ENCOUNTER — Telehealth: Payer: Self-pay | Admitting: *Deleted

## 2017-07-05 NOTE — Telephone Encounter (Signed)
Received fax from [facility] re: Tecfidera 240 mg caps shipped to patient on 07/04/17.

## 2017-08-17 ENCOUNTER — Telehealth: Payer: Self-pay | Admitting: *Deleted

## 2017-08-17 NOTE — Telephone Encounter (Signed)
Received notice: Tecfidera DR 240 mg shipped 07/31/17.

## 2017-09-11 ENCOUNTER — Telehealth: Payer: Self-pay | Admitting: *Deleted

## 2017-09-11 NOTE — Telephone Encounter (Signed)
[facility] re: [place] [address] DR 240 mg caps shipped to patient on 08/31/17.

## 2017-11-02 ENCOUNTER — Telehealth: Payer: Self-pay | Admitting: *Deleted

## 2017-11-02 NOTE — Telephone Encounter (Signed)
Received fax from [facility] re: Tecfidera DR shipped 11/01/17.

## 2017-11-20 ENCOUNTER — Other Ambulatory Visit: Payer: Self-pay | Admitting: Diagnostic Neuroimaging

## 2017-11-20 NOTE — Telephone Encounter (Signed)
LMVM for pt to return call mobile # to make appt.  Last seen 2017.  Pt on tecfidera.

## 2017-11-23 NOTE — Telephone Encounter (Signed)
Spoke to pt and las seen 2017.  He had collection and stated will start paying next month.  I will ask billing and then see where to got from there.

## 2017-11-23 NOTE — Telephone Encounter (Signed)
I spoke to pt and relayed that he needs to call billing and pay something on his bill, so we can make appt and get refills for his medication (tecfidera).  He verbalized understanding and stated he would call back.

## 2017-11-30 ENCOUNTER — Telehealth: Payer: Self-pay | Admitting: Diagnostic Neuroimaging

## 2017-11-30 MED ORDER — DIMETHYL FUMARATE 240 MG PO CPDR: DELAYED_RELEASE_CAPSULE | ORAL | 0 refills | Status: DC

## 2017-11-30 NOTE — Telephone Encounter (Signed)
LMVM for pt to return call and will [place] for appt since has not been see for 2 yrs and on MS medication tecfidera.

## 2017-11-30 NOTE — Telephone Encounter (Signed)
Pt called needing refills for TECFIDERA 240 MG CPDR [facility] to [facility]. Scheduled pt a f/u with Dr. Marjory Lies for 1/20 at 4 (first available)

## 2017-11-30 NOTE — Telephone Encounter (Signed)
Spoke to pt and made appt for 12-04-17 at 1500 for MS appt. Last seen 2017 needing tecfidera. (ok for 30 day supply).

## 2017-12-04 ENCOUNTER — Telehealth: Payer: Self-pay | Admitting: Diagnostic Neuroimaging

## 2017-12-04 ENCOUNTER — Ambulatory Visit: Payer: BC Managed Care – PPO | Admitting: Diagnostic Neuroimaging

## 2017-12-04 ENCOUNTER — Encounter: Payer: Self-pay | Admitting: Diagnostic Neuroimaging

## 2017-12-04 VITALS — BP 99/59 | HR 54 | Ht 67.0 in | Wt 134.6 lb

## 2017-12-04 DIAGNOSIS — G35 Multiple sclerosis: Secondary | ICD-10-CM

## 2017-12-04 NOTE — Telephone Encounter (Signed)
[employer] Auth: [id number] (exp. 12/04/17 to 01/02/18) order sent to GI. They will reach out to the pt to schedule.

## 2017-12-04 NOTE — Progress Notes (Signed)
[facility]  PATIENT: Shane Rollins DOB: [birth year]  REFERRING CLINICIAN:  HISTORY FROM: patient REASON FOR VISIT: follow up   HISTORICAL  CHIEF COMPLAINT:  Chief Complaint  Patient presents with  . Follow-up    Rm 6, alone  . Multiple Sclerosis    doing ok, last seen 2017, on tecfidera for MS.    HISTORY OF PRESENT ILLNESS:   UPDATE (12/04/17, VRP): Since last visit, doing well. Symptoms are stable. Severity is mild. No alleviating or aggravating factors. Tolerating tecfidera. Lost to follow up due to financial issues. No numbness or tingling. Fatigue fluctuates.  UPDATE 11/30/15: Since last visit, doing about the same. Some intermittent urinary frequency/urgency and incomplete bladder emptying sensation. No pain or dysuria.   UPDATE 07/30/15: Since last visit, doing well. No new major issues. Some intermittent numbness. Some intermittent urinary frequency issues.  UPDATE 07/15/14: Since last visit, doing well. No new events. Int facial numbness continues. Still with some left hand/wrist/finger joint pains.  UPDATE 04/14/14: Since last visit, has had some intermittent facial numbness, night sweats, joint pain. Also with increasing, nagging headaches. He notes that he skips lunch most days. He was drinking Etoh 1-2 drinks per night, but recently quit, to see if HA would improve, but thery haven't changed much.   UPDATE 08/29/13: Since last visit doing well. Tolerating tecfidera. 1 month ago had 1 week of night sweats, with sinus pressure and 1 day of fever. Some intermittent headaches with frontal sinus pressure. No nausea, vomiting, photphobia or phonophobia.   UPDATE 05/24/13: Since last visit, vision continue to improve. Now able to drive without eye patch. Some occ double vision with extreme right gaze. Also with intermittent, brief numbness in face, burning pains in left shoulder/scapula. Overall tolerating tecfidera (started 04/24/13), with no sig side effects.  Taking meds with foods. Occ "quesey" stomach, but not nausea/vomiting/diarrhea.  UPDATE 04/10/13: Since last visit, right eye has been slightly improving. No new symptoms. Here to review test results, diagnosis, and treatment options.   UPDATE 03/04/13: Since last visit, symptoms stable. MRI results reviewed. No new events.  PRIOR HPI (02/19/13): 42 year old right-handed male here for evaluation of double vision. 2 weeks ago patient sudden onset double vision when looking to the right side. 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 Symptoms have been stable since that time. When patient covers one eye he does not have double vision. Symptoms are worse when he looks to the right or when he is outdoors. No prodromal illness, infection, cough, cold, change in diet or exercise. No traumas. In September an episode of left arm and left leg tingling sensation lasting for several hours. Patient had intermittent headaches and dizziness.   REVIEW OF SYSTEMS: Full 14 system review of systems performed and negative except: freq urination fatigue.      ALLERGIES: Allergies  Allergen Reactions  . Codeine Nausea And Vomiting    HOME MEDICATIONS: Outpatient Encounter Medications as of 12/04/2017  Medication Sig  . Dimethyl Fumarate (TECFIDERA) 240 MG CPDR TAKE ONE CAPSULE (240 MG) BY MOUTH TWICE DAILY. STORE IN ORIGINAL CONTAINER AT ROOM TEMPERATURE.  . ibuprofen (ADVIL,MOTRIN) 800 MG tablet Take 800 mg by mouth every 6 (six) hours as needed. for pain  . Multiple Vitamin (MULTIVITAMIN) tablet Take 1 tablet by mouth daily.   No facility-administered encounter medications on file as of 12/04/2017.     PAST MEDICAL HISTORY: Past Medical History:  Diagnosis Date  .  Multiple sclerosis (HCC)    PAST SURGICAL HISTORY: No past surgical history on file.  FAMILY HISTORY: Family History  Problem Relation Age of Onset  . Healthy Mother   . Healthy Father   . Congestive Heart  Failure Unknown   . Depression Unknown     SOCIAL HISTORY:  Social History   Socioeconomic History  . Marital status: Single    Spouse name: Not on file  . Number of children: 0  . Years of education: BA  . 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 Financial resource strain: Not on file  . Food insecurity:    Worry: Not on file    Inability: Not on file  . Transportation needs:    Medical: Not on file    Non-medical: Not on file  Tobacco Use  . Smoking status: Former Smoker    Packs/day: 0.50    Years: 12.00    Pack years: 6.00    Types: Cigarettes    Last attempt to quit: 03/15/2007    Years since quitting: 10.7  . Smokeless tobacco: Never Used  Substance and Sexual Activity  . Alcohol use: Yes    Comment: 2 drinks weekly  . Drug use: No  . Sexual activity: Not on file  Lifestyle  . Physical activity:    Days per week: Not on file    Minutes per session: Not on file  . Stress: Not on file  Relationships  . Social connections:    Talks on phone: Not on file    Gets together: Not on file    Attends religious service: Not on file    Active member of club or organization: Not on file    Attends meetings of clubs or organizations: Not on file    Relationship status: Not on file  . Intimate partner violence:    Fear of current or ex partner: Not on file    Emotionally abused: Not on file    Physically abused: Not on file    Forced sexual activity: Not on file  Other Topics Concern  . Not on file  Social History Narrative   Patient lives at home alone.   Caffeine Use: 1 cup daily     PHYSICAL EXAM  Vitals:   12/04/17 1459 12/04/17 1506 12/04/17 1507  BP: 95/61 112/72 (!) 99/59  Pulse: (!) 52  (!) 54  SpO2: 99%    Weight: 134 lb 9.6 oz (61.1 kg)    Height: 5\' 7"  (1.702 m)      Not recorded     Wt Readings from Last 3 Encounters:  12/04/17 134 lb 9.6  oz (61.1 kg)  11/30/15 133 lb 6.4 oz (60.5 kg)  07/30/15 132 lb 9.6 oz (60.1 kg)   Body mass index is 21.08 kg/m.  GENERAL EXAM: Patient is in no distress; well developed, nourished and groomed; neck is supple  CARDIOVASCULAR: Regular rate and rhythm, no murmurs, no carotid bruits  NEUROLOGIC: MENTAL STATUS: awake, alert, language fluent, comprehension intact, naming intact, fund of knowledge appropriate CRANIAL NERVE: pupils equal and reactive to light, visual fields full to confrontation, extraocular muscles intact, no nystagmus, facial sensation and strength symmetric, hearing intact, palate elevates symmetrically, uvula midline, shoulder shrug symmetric, tongue midline. MOTOR: normal bulk and tone, full strength in the BUE, BLE SENSORY: normal and symmetric to light touch COORDINATION: finger-nose-finger, fine  finger movements normal REFLEXES: deep tendon reflexes present and symmetric GAIT/STATION: narrow based gait; romberg is negative; TANDEM STABLE    DIAGNOSTIC DATA (LABS, IMAGING, TESTING) - I reviewed patient records, labs, notes, testing and imaging myself where available.  Lab Results  Component Value Date   WBC 6.0 07/30/2015   HGB 13.2 07/30/2015   HCT 40.2 07/30/2015   MCV 95 07/30/2015   PLT 259 07/30/2015      Component Value Date/Time   NA 142 07/30/2015 1703   K 4.0 07/30/2015 1703   CL 102 07/30/2015 1703   CO2 21 07/30/2015 1703   GLUCOSE 83 07/30/2015 1703   GLUCOSE 137 (H) 10/24/2010 2234   BUN 7 07/30/2015 1703   CREATININE 0.73 (L) 07/30/2015 1703   CALCIUM 9.5 07/30/2015 1703   PROT 7.4 07/30/2015 1703   ALBUMIN 4.9 07/30/2015 1703   AST 24 07/30/2015 1703   ALT 15 07/30/2015 1703   ALKPHOS 84 07/30/2015 1703   BILITOT 0.5 07/30/2015 1703   GFRNONAA 118 07/30/2015 1703   GFRAA 137 07/30/2015 1703   No results found for: CHOL Lab Results  Component Value Date   HGBA1C 5.6 02/19/2013   Lab Results  Component Value Date    VITAMINB12 357 07/30/2015   Lab Results  Component Value Date   TSH 1.320 02/19/2013   Vit D, 25-Hydroxy  Date Value Ref Range Status  07/30/2015 27.0 (L) 30.0 - 100.0 ng/mL Final    Comment:    Vitamin D deficiency has been defined by the Institute of Medicine and an Endocrine Society practice guideline as a level of serum 25-OH vitamin D less than 20 ng/mL (1,2). The Endocrine Society went on to further define vitamin D insufficiency as a level between 21 and 29 ng/mL (2). 1. IOM (Institute of Medicine). 2010. Dietary reference    intakes for calcium and D. [place]: The    [employer]. 2. Holick MF, Binkley [place], Bischoff-Ferrari HA, et al.    Evaluation, treatment, and prevention of vitamin D    deficiency: an Endocrine Society clinical practice    guideline. JCEM. 2011 Jul; 96(7):1911-30.   08/29/2013 45.8 30.0 - 100.0 ng/mL Final    Comment:    Vitamin D deficiency has been defined by the Institute of Medicine and an Endocrine Society practice guideline as a level of serum 25-OH vitamin D less than 20 ng/mL (1,2). The Endocrine Society went on to further define vitamin D insufficiency as a level between 21 and 29 ng/mL (2). 1. IOM (Institute of Medicine). 2010. Dietary reference    intakes for calcium and D. [place]: The    [employer]. 2. Holick MF, Binkley [place], Bischoff-Ferrari HA, et al.    Evaluation, treatment, and prevention of vitamin D    deficiency: an Endocrine Society clinical practice    guideline. JCEM. 2011 Jul; 96(7):1911-30.  05/24/2013 12.9 (L) 30.0 - 100.0 ng/mL Final    Comment:    Vitamin D deficiency has been defined by the Institute of Medicine and an Endocrine Society practice guideline as a level of serum 25-OH vitamin D less than 20 ng/mL (1,2). The Endocrine Society went on to further define vitamin D insufficiency as a level between 21 and 29 ng/mL (2). 1. IOM (Institute of Medicine). 2010. Dietary  reference    intakes for calcium and D. [place]: The    [employer]. 2. Holick MF, Binkley [place], Bischoff-Ferrari HA, et al.    Evaluation, treatment,  and prevention of vitamin D    deficiency: an Endocrine Society clinical practice    guideline. JCEM. 2011 Jul; 96(7):1911-30.    03/25/13 MRI cervical  - Right anterior T2 hyperintense spinal cord lesion from C2 to C3-4 level, measuring 3.0cm in cranio-caudal extent, and 0.4cm in antero-posterior extent.  - Right ponto-medullary T2 hyperintense lesion.  - Findings suspicious for chronic demyelinating disease. Other considerations include autoimmune, inflammatory, post-infectious or ischemic etiologies.  - No acute findings.  03/25/13 MRI thoracic - normal  05/15/14 MRI brain -  1. There are multiple periventricular, subcortical, juxtacortical, midbrain, pontine chronic demyelinating plaques. No acute plaques.  2. Compared to MRI on 09/04/13, there is no significant change.  03/04/13 Labs (B12, NMO ab, RPR, Hep B, Hep C, HIV, ANCA, ANA, Tb) - all negative/normal  03/04/13 JCV antibody - POSITIVE 1.95 (H)   ASSESSMENT AND PLAN  42 y.o. year old male here with sudden onset double vision in Nov/Dec 2014. MRI brain and c-spine consistent with multiple sclerosis. MS lab mimics were negative. Now on Tecfidera since 04/24/13, and doing well.  Dx: multiple sclerosis  MS (multiple sclerosis) (HCC) - Plan: CBC with Differential/Platelet, Comprehensive metabolic panel  Multiple sclerosis (HCC) - Plan: MR BRAIN W WO CONTRAST    PLAN:  MULTIPLE SCLEROSIS (stable, on tecfidera) - continue tecfidera - check labs and MRI brain (surveillance) - continue multi-vitamin, vitamin D 1000 units daily  Orders Placed This Encounter  Procedures  . MR BRAIN W WO CONTRAST  . CBC with Differential/Platelet  . Comprehensive metabolic panel   Return in about 6 months (around 06/04/2018) for with NP.    Suanne Marker, MD  12/04/2017, 3:34 PM Certified in Neurology, Neurophysiology and Neuroimaging  [facility] [address], [address] [place], [place] [address] [phone]

## 2017-12-07 ENCOUNTER — Telehealth: Payer: Self-pay | Admitting: *Deleted

## 2017-12-07 NOTE — Telephone Encounter (Signed)
[facility] tecfidera shipped 12-01-17.

## 2017-12-19 ENCOUNTER — Other Ambulatory Visit: Payer: Self-pay | Admitting: Diagnostic Neuroimaging

## 2018-02-05 NOTE — Telephone Encounter (Signed)
Received [facility] from [facility] that tecf[facility]a shipped 01-29-2018.

## 2018-04-02 ENCOUNTER — Ambulatory Visit: Payer: BC Managed Care – PPO | Admitting: Diagnostic Neuroimaging

## 2018-04-29 ENCOUNTER — Ambulatory Visit
Admission: RE | Admit: 2018-04-29 | Discharge: 2018-04-29 | Disposition: A | Discharge status: Home / Self Care | Payer: BC Managed Care – PPO | Source: Ambulatory Visit | Attending: Diagnostic Neuroimaging | Admitting: Diagnostic Neuroimaging

## 2018-04-29 DIAGNOSIS — G35 Multiple sclerosis: Secondary | ICD-10-CM

## 2018-04-29 MED ORDER — GADOBENATE DIMEGLUMINE 529 MG/ML IV SOLN
12.0000 mL | Freq: Once | INTRAVENOUS | Status: AC | PRN
  Administered 2018-04-29: 12 mL via INTRAVENOUS

## 2018-05-01 ENCOUNTER — Encounter: Payer: Self-pay | Admitting: *Deleted

## 2018-05-01 ENCOUNTER — Telehealth: Payer: Self-pay | Admitting: *Deleted

## 2018-05-01 NOTE — Telephone Encounter (Signed)
LMVM for pt on cell # that MRI brain stable, no new findings.  He is to call back if questions.

## 2018-05-01 NOTE — Telephone Encounter (Signed)
-----   Message from Suanne Marker, MD sent at 04/30/2018  5:05 PM EST ----- Stable MRI results. No new findings. Please call patient. Continue current plan. -VRP

## 2018-05-07 ENCOUNTER — Telehealth: Payer: Self-pay | Admitting: *Deleted

## 2018-05-07 NOTE — Telephone Encounter (Signed)
[facility]: Tecfidera DR 240 mg shipped 05/01/2018.

## 2018-05-15 NOTE — Telephone Encounter (Signed)
Mailed letter to pt, (printed copy of Mychart message).

## 2018-05-30 NOTE — Progress Notes (Deleted)
PATIENT: Shane Rollins DOB: 1975-12-02  REASON FOR VISIT: follow up HISTORY FROM: patient  No chief complaint on file.    HISTORY OF PRESENT ILLNESS: Today 05/30/18 Shane Rollins is a 43 y.o. male here today for follow up for MS.     HISTORY: (copied from Dr Richrd Humbles note on 12/04/2017) UPDATE (12/04/17, VRP): Since last visit, doing well. Symptoms are stable. Severity is mild. No alleviating or aggravating factors. Tolerating tecfidera. Lost to follow up due to financial issues. No numbness or tingling. Fatigue fluctuates.  UPDATE 11/30/15: Since last visit, doing about the same. Some intermittent urinary frequency/urgency and incomplete bladder emptying sensation. No pain or dysuria.   UPDATE 07/30/15: Since last visit, doing well. No new major issues. Some intermittent numbness. Some intermittent urinary frequency issues.  UPDATE 07/15/14: Since last visit, doing well. No new events. Int facial numbness continues. Still with some left hand/wrist/finger joint pains.  UPDATE 04/14/14: Since last visit, has had some intermittent facial numbness, night sweats, joint pain. Also with increasing, nagging headaches. He notes that he skips lunch most days. He was drinking Etoh 1-2 drinks per night, but recently quit, to see if HA would improve, but thery haven't changed much.   UPDATE 08/29/13: Since last visit doing well. Tolerating tecfidera. 1 month ago had 1 week of night sweats, with sinus pressure and 1 day of fever. Some intermittent headaches with frontal sinus pressure. No nausea, vomiting, photphobia or phonophobia.   UPDATE 05/24/13: Since last visit, vision continue to improve. Now able to drive without eye patch. Some occ double vision with extreme right gaze. Also with intermittent, brief numbness in face, burning pains in left shoulder/scapula. Overall tolerating tecfidera (started 04/24/13), with no sig side effects. Taking meds with foods. Occ "quesey" stomach, but not  nausea/vomiting/diarrhea.  UPDATE 04/10/13: Since last visit, right eye has been slightly improving. No new symptoms. Here to review test results, diagnosis, and treatment options.   UPDATE 03/04/13: Since last visit, symptoms stable. MRI results reviewed. No new events.  PRIOR HPI (02/19/13): 43 year old right-handed male here for evaluation of double vision. 2 weeks ago patient sudden onset double vision when looking to the right side. 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 Symptoms have been stable since that time. When patient covers one eye he does not have double vision. Symptoms are worse when he looks to the right or when he is outdoors. No prodromal illness, infection, cough, cold, change in diet or exercise. No traumas. In September an episode of left arm and left leg tingling sensation lasting for several hours. Patient had intermittent headaches and dizziness.  REVIEW OF SYSTEMS: Out of a complete 14 system review of symptoms, the patient complains only of the following symptoms, and all other reviewed systems are negative.  ALLERGIES: Allergies  Allergen Reactions  . Codeine Nausea And Vomiting    HOME MEDICATIONS: Outpatient Medications Prior to Visit  Medication Sig Dispense Refill  . ibuprofen (ADVIL,MOTRIN) 800 MG tablet Take 800 mg by mouth every 6 (six) hours as needed. for pain  0  . Multiple Vitamin (MULTIVITAMIN) tablet Take 1 tablet by mouth daily.    . TECFIDERA 240 MG CPDR TAKE ONE CAPSULE BY MOUTH TWICE DAILY. STORE IN ORIGINAL CONTAINER ATROOM TEMPERATURE. 60 capsule 5   No facility-administered medications prior to visit.     PAST MEDICAL HISTORY: Past Medical History:  Diagnosis Date  . Multiple sclerosis (HCC)  PAST SURGICAL HISTORY: No past surgical history on file.  FAMILY HISTORY: Family History  Problem Relation Age of Onset  . Healthy Mother   . Healthy Father   . Congestive Heart Failure Unknown   .  Depression Unknown     SOCIAL HISTORY: Social History   Socioeconomic History  . Marital status: Single    Spouse name: Not on file  . Number of children: 0  . Years of education: BA  . 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 Financial resource strain: Not on file  . Food insecurity:    Worry: Not on file    Inability: Not on file  . Transportation needs:    Medical: Not on file    Non-medical: Not on file  Tobacco Use  . Smoking status: Former Smoker    Packs/day: 0.50    Years: 12.00    Pack years: 6.00    Types: Cigarettes    Last attempt to quit: 03/15/2007    Years since quitting: 11.2  . Smokeless tobacco: Never Used  Substance and Sexual Activity  . Alcohol use: Yes    Comment: 2 drinks weekly  . Drug use: No  . Sexual activity: Not on file  Lifestyle  . Physical activity:    Days per week: Not on file    Minutes per session: Not on file  . Stress: Not on file  Relationships  . Social connections:    Talks on phone: Not on file    Gets together: Not on file    Attends religious service: Not on file    Active member of club or organization: Not on file    Attends meetings of clubs or organizations: Not on file    Relationship status: Not on file  . Intimate partner violence:    Fear of current or ex partner: Not on file    Emotionally abused: Not on file    Physically abused: Not on file    Forced sexual activity: Not on file  Other Topics Concern  . Not on file  Social History Narrative   Patient lives at home alone.   Caffeine Use: 1 cup daily      PHYSICAL EXAM  There were no vitals filed for this visit. There is no height or weight on file to calculate BMI.  Generalized: Well developed, in no acute distress  Cardiology: normal rate and rhythm, no murmur noted Neurological examination  Mentation: Alert oriented to time, place, history  taking. Follows all commands speech and language fluent Cranial nerve II-XII: Pupils were equal round reactive to light. Extraocular movements were full, visual field were full on confrontational test. Facial sensation and strength were normal. Uvula tongue midline. Head turning and shoulder shrug  were normal and symmetric. Motor: The motor testing reveals 5 over 5 strength of all 4 extremities. Good symmetric motor tone is noted throughout.  Sensory: Sensory testing is intact to soft touch on all 4 extremities. No evidence of extinction is noted.  Coordination: Cerebellar testing reveals good finger-nose-finger and heel-to-shin bilaterally.  Gait and station: Gait is normal. Tandem gait is normal. Romberg is negative. No drift is seen.  Reflexes: Deep tendon reflexes are symmetric and normal bilaterally.   DIAGNOSTIC DATA (LABS, IMAGING, TESTING) - I reviewed patient records, labs, notes, testing and imaging myself where available.  No flowsheet data found.  Lab Results  Component Value Date   WBC 6.0 07/30/2015   HGB 13.2 07/30/2015   HCT 40.2 07/30/2015   MCV 95 07/30/2015   PLT 259 07/30/2015      Component Value Date/Time   NA 142 07/30/2015 1703   K 4.0 07/30/2015 1703   CL 102 07/30/2015 1703   CO2 21 07/30/2015 1703   GLUCOSE 83 07/30/2015 1703   GLUCOSE 137 (H) 10/24/2010 2234   BUN 7 07/30/2015 1703   CREATININE 0.73 (L) 07/30/2015 1703   CALCIUM 9.5 07/30/2015 1703   PROT 7.4 07/30/2015 1703   ALBUMIN 4.9 07/30/2015 1703   AST 24 07/30/2015 1703   ALT 15 07/30/2015 1703   ALKPHOS 84 07/30/2015 1703   BILITOT 0.5 07/30/2015 1703   GFRNONAA 118 07/30/2015 1703   GFRAA 137 07/30/2015 1703   No results found for: CHOL, HDL, LDLCALC, LDLDIRECT, TRIG, CHOLHDL Lab Results  Component Value Date   HGBA1C 5.6 02/19/2013   Lab Results  Component Value Date   VITAMINB12 357 07/30/2015   Lab Results  Component Value Date   TSH 1.320 02/19/2013        ASSESSMENT AND PLAN 43 y.o. year old male  has a past medical history of Multiple sclerosis (HCC). here with ***    ICD-10-CM   1. Multiple sclerosis (HCC) G35        No orders of the defined types were placed in this encounter.    No orders of the defined types were placed in this encounter.     I spent 15 minutes with the patient. 50% of this time was spent counseling and educating patient on plan of care and medications.    Shawnie Dapper, FNP-C 05/30/2018, 2:04 PM [facility] [address] [place], [place] [address] [phone]

## 2018-06-04 ENCOUNTER — Ambulatory Visit: Payer: BC Managed Care – PPO | Admitting: Family Medicine

## 2018-06-04 ENCOUNTER — Telehealth: Payer: Self-pay

## 2018-06-04 ENCOUNTER — Ambulatory Visit: Payer: BC Managed Care – PPO | Admitting: Nurse Practitioner

## 2018-06-04 NOTE — Telephone Encounter (Signed)
Unable to get in contact with the patient. I left a voicemail asking him to return my call to possibly discuss doing a telephone visit with Amy today. Office number was provided.

## 2018-06-27 ENCOUNTER — Other Ambulatory Visit: Payer: Self-pay | Admitting: Diagnostic Neuroimaging

## 2018-06-28 ENCOUNTER — Telehealth: Payer: Self-pay | Admitting: *Deleted

## 2018-06-28 ENCOUNTER — Encounter: Payer: Self-pay | Admitting: *Deleted

## 2018-06-28 NOTE — Telephone Encounter (Signed)
Called patient and advised him I received refill request for Tecfidera. I advised that he needs a FU, and due to current COVID 19 pandemic, our office is severely reducing in person visits in order to minimize the risk to our patients and healthcare providers. We recommend to convert your appointment to a video visit. We'll take all precautions to reduce any security or privacy concerns. This will be treated like an office visit, and we will file with your insurance.  He consented to video visit. Pt's email is [email or web address]. Pt understands that the [employer] webex software must be downloaded and operational on the device pt plans to use for the visit. Updated EMR. He had no questions,  verbalized understanding, appreciation. Webex scheduled; e mail sent.

## 2018-07-03 NOTE — Telephone Encounter (Signed)
Pt called re: the appointment, it will conflict with a meeting. Pt was given option to reschedule and he declined. Pt states he will call back to r/s

## 2018-07-03 NOTE — Telephone Encounter (Signed)
Noted. Per phone staff patient prefers to wait until he can come into office.

## 2018-07-04 ENCOUNTER — Ambulatory Visit: Payer: Self-pay | Admitting: Diagnostic Neuroimaging

## 2018-07-31 ENCOUNTER — Encounter: Payer: Self-pay | Admitting: *Deleted

## 2018-07-31 ENCOUNTER — Telehealth: Payer: Self-pay | Admitting: *Deleted

## 2018-07-31 NOTE — Telephone Encounter (Signed)
[facility], Tecfidera DR 240 mg shipped 07/26/18, sent patient my chart advising him to call an schedule in office visit in August.

## 2018-08-07 ENCOUNTER — Telehealth: Payer: Self-pay | Admitting: *Deleted

## 2018-08-07 NOTE — Telephone Encounter (Signed)
[facility] re: Tecfidera 240 mg tabs shipped 07/26/18.

## 2018-09-03 NOTE — Telephone Encounter (Signed)
[facility]: tecfidera DR 240 mg  shipped 08/30/2018.

## 2018-10-08 NOTE — Telephone Encounter (Signed)
[facility]: Tecfidera DR 240 mg caps shipped 10/02/2018.

## 2018-11-05 ENCOUNTER — Telehealth: Payer: Self-pay | Admitting: *Deleted

## 2018-11-05 NOTE — Telephone Encounter (Signed)
[facility]: Tecfidera DR 240 mg shipped on 10/31/18.

## 2018-12-03 ENCOUNTER — Telehealth: Payer: Self-pay | Admitting: Diagnostic Neuroimaging

## 2018-12-03 DIAGNOSIS — G35 Multiple sclerosis: Secondary | ICD-10-CM

## 2018-12-03 MED ORDER — DIMETHYL FUMARATE 240 MG PO CPDR: DELAYED_RELEASE_CAPSULE | ORAL | 5 refills | Status: DC

## 2018-12-03 NOTE — Telephone Encounter (Signed)
[facility]a reordered to [facility] with note added: brand medically necessary.

## 2018-12-03 NOTE — Addendum Note (Signed)
Addended by: Minna Antis on: 12/03/2018 04:32 PM   Modules accepted: Orders

## 2018-12-03 NOTE — Telephone Encounter (Signed)
Patient came into office today without appointment and explains that he is needing his Tecfidera resent to his pharmacy. He states pharmacy has the prescription ready but is the generic. Patient's insurance will not pay for generic, so patient is requesting that Dr. Leta Baptist send in a new prescription for the name brand. Patient is requesting this refill today as he is low/out.

## 2018-12-10 NOTE — Telephone Encounter (Signed)
Received fax from [facility] pharmacy: dimethyl fumarate DR 240 mg ( tecfidera) shipped 12/04/2018.

## 2019-01-07 NOTE — Telephone Encounter (Signed)
Received fax from [facility] re: dimethyl fumarate (tecfidera) 240 mg caps shipped on 01/01/2019.

## 2019-02-04 NOTE — Telephone Encounter (Signed)
[facility]: dimethyl fumarate DR 240 mg shipped on 01/29/19.

## 2019-02-14 ENCOUNTER — Telehealth: Payer: Self-pay | Admitting: *Deleted

## 2019-02-14 NOTE — Telephone Encounter (Signed)
Tecfidera PA form signed, faxed to [facility].

## 2019-02-14 NOTE — Telephone Encounter (Signed)
Received fax from [facility]: Tecfidera PA. questions answered, placed on Dr [employer] desk for review, signature.

## 2019-02-25 ENCOUNTER — Ambulatory Visit (HOSPITAL_COMMUNITY)
Admission: EM | Admit: 2019-02-25 | Discharge: 2019-02-25 | Disposition: A | Discharge status: Home / Self Care | Payer: BC Managed Care – PPO | Attending: Family Medicine | Admitting: Family Medicine

## 2019-02-25 ENCOUNTER — Other Ambulatory Visit: Payer: Self-pay

## 2019-02-25 ENCOUNTER — Encounter (HOSPITAL_COMMUNITY): Payer: Self-pay

## 2019-02-25 DIAGNOSIS — R369 Urethral discharge, unspecified: Secondary | ICD-10-CM

## 2019-02-25 DIAGNOSIS — Z202 Contact with and (suspected) exposure to infections with a predominantly sexual mode of transmission: Secondary | ICD-10-CM | POA: Diagnosis not present

## 2019-02-25 DIAGNOSIS — Z113 Encounter for screening for infections with a predominantly sexual mode of transmission: Secondary | ICD-10-CM | POA: Diagnosis not present

## 2019-02-25 MED ORDER — AZITHROMYCIN 250 MG PO TABS
1000.0000 mg | ORAL_TABLET | Freq: Once | ORAL | Status: AC
  Administered 2019-02-25: 16:00:00 1000 mg via ORAL

## 2019-02-25 MED ORDER — AZITHROMYCIN 250 MG PO TABS
ORAL_TABLET | ORAL | Status: AC
  Filled 2019-02-25: qty 4

## 2019-02-25 MED ORDER — CEFTRIAXONE SODIUM 250 MG IJ SOLR
INTRAMUSCULAR | Status: AC
  Filled 2019-02-25: qty 250

## 2019-02-25 MED ORDER — CEFTRIAXONE SODIUM 250 MG IJ SOLR
250.0000 mg | Freq: Once | INTRAMUSCULAR | Status: AC
  Administered 2019-02-25: 16:00:00 250 mg via INTRAMUSCULAR

## 2019-02-25 NOTE — ED Provider Notes (Addendum)
[place]    CSN: [id number] Arrival date & time: 02/25/19  1448      History   Chief Complaint Chief Complaint  Patient presents with  . Penile Discharge    HPI Shane Rollins is a 43 y.o. male.   Pt is a 43 year old male that presents with penile discharge, itching. Symptoms have been constant since yesterday. Currently sexually active with one partner unprotected. No significant dysuria. No abd pain, back pain, fever.   ROS per HPI      Past Medical History:  Diagnosis Date  . Multiple sclerosis [facility])     Patient Active Problem List   Diagnosis Date Noted  . Multiple sclerosis ([place]) 04/10/2013  . 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 No pertinent surgical history.     Home Medications    Prior to Admission medications   Medication Sig Start Date End Date Taking? Authorizing Provider  Dimethyl Fumarate (TECFIDERA) 240 MG CPDR Brand medically necessary. TAKE ONE CAPSULE BY MOUTH TWICE DAILY. STORE IN ORIGINAL CONTAINER AT ROOM TEMPERATURE. 12/03/18   Penumalli, Earlean Polka, MD  ibuprofen (ADVIL,MOTRIN) 800 MG tablet Take 800 mg by mouth every 6 (six) hours as needed. for pain 02/14/14   [provider]  Multiple Vitamin (MULTIVITAMIN) tablet Take 1 tablet by mouth daily.    [provider]    Family History Family History  Problem Relation Age of Onset  . Healthy Mother   . Healthy Father   . Congestive Heart Failure Other   . Depression Other     Social History Social History   Tobacco Use  . Smoking status: Former Smoker    Packs/day: 0.50    Years: 12.00    Pack years: 6.00    Types: Cigarettes    Quit date: 03/15/2007    Years since quitting: 11.9  . Smokeless tobacco: Never Used  Substance Use Topics  . Alcohol use: Yes    Comment: 2 drinks weekly  . Drug use: No     Allergies   Codeine   Review of Systems Review of Systems  Genitourinary: Negative for decreased urine volume,  difficulty urinating, discharge, dysuria, enuresis, flank pain, frequency, genital sores, hematuria, penile pain, penile swelling, scrotal swelling, testicular pain and urgency.     Physical Exam Triage Vital Signs ED Triage Vitals  Enc Vitals Group     BP 02/25/19 1521 (!) 144/74     Pulse Rate 02/25/19 1521 85     Resp 02/25/19 1521 16     Temp 02/25/19 1521 98.5 F (36.9 C)     Temp Source 02/25/19 1521 Oral     SpO2 02/25/19 1521 99 %     Weight --      Height --      Head Circumference --      Peak Flow --      Pain Score 02/25/19 1522 0     Pain Loc --      Pain Edu? --      Excl. in [place]? --    No data found.  Updated Vital Signs BP (!) 144/74 (BP Location: Right Arm)   Pulse 85   Temp 98.5 F (36.9 C) (Oral)   Resp 16   SpO2 99%   Visual Acuity Right Eye Distance:   Left Eye Distance:   Bilateral Distance:    Right Eye Near:   Left Eye Near:    Bilateral Near:  Physical Exam Vitals and nursing note reviewed.  Constitutional:      Appearance: Normal appearance.  HENT:     Head: Normocephalic and atraumatic.     Nose: Nose normal.  Eyes:     Conjunctiva/sclera: Conjunctivae normal.  Pulmonary:     Effort: Pulmonary effort is normal.  Abdominal:     Palpations: Abdomen is soft.     Tenderness: There is no abdominal tenderness.  Musculoskeletal:        General: Normal range of motion.     Cervical back: Normal range of motion.  Skin:    General: Skin is warm and dry.  Neurological:     Mental Status: He is alert.  Psychiatric:        Mood and Affect: Mood normal.      UC Treatments / Results  Labs (all labs ordered are listed, but only abnormal results are displayed) Labs Reviewed  CYTOLOGY, (ORAL, ANAL, URETHRAL) ANCILLARY ONLY    EKG   Radiology No results found.  Procedures Procedures (including critical care time)  Medications Ordered in UC Medications  cefTRIAXone (ROCEPHIN) injection 250 mg (has no administration in  time range)  azithromycin (ZITHROMAX) tablet 1,000 mg (has no administration in time range)    Initial Impression / Assessment and Plan / UC Course  I have reviewed the triage vital signs and the nursing notes.  Pertinent labs & imaging results that were available during my care of the patient were reviewed by me and considered in my medical decision making (see chart for details).     Penile discharge-treating prophylactically for gonorrhea and chlamydia today in clinic.  Penile swab sent for testing  Final Clinical Impressions(s) / UC Diagnoses   Final diagnoses:  Penile discharge     Discharge Instructions     Treating you for gonorrhea and chlamydia today based on symptoms We will send your swab for testing and call you with any positive results.    ED Prescriptions    None     PDMP not reviewed this encounter.        Janace Aris, NP 02/25/19 1542

## 2019-02-25 NOTE — Discharge Instructions (Addendum)
Treating you for gonorrhea and chlamydia today based on symptoms We will send your swab for testing and call you with any positive results.

## 2019-02-25 NOTE — ED Triage Notes (Signed)
Pt present penile discharge with some itching and burning. Symptom started yesterday.

## 2019-02-26 ENCOUNTER — Telehealth (HOSPITAL_COMMUNITY): Payer: Self-pay | Admitting: Emergency Medicine

## 2019-02-26 LAB — CYTOLOGY, (ORAL, ANAL, URETHRAL) ANCILLARY ONLY
Chlamydia: NEGATIVE
Neisseria Gonorrhea: NEGATIVE
Trichomonas: POSITIVE — AB

## 2019-02-26 MED ORDER — METRONIDAZOLE 500 MG PO TABS: 2000.0000 mg | ORAL_TABLET | Freq: Once | ORAL | 0 refills | Status: AC

## 2019-02-26 NOTE — Telephone Encounter (Signed)
Trichomonas is positive. Rx  for Flagyl 2 grams, once was sent to the pharmacy of record. Pt needs education to refrain from sexual intercourse for 7 days to give the medicine time to work. Sexual partners need to be notified and tested/treated. Condoms may reduce risk of reinfection. Recheck for further evaluation if symptoms are not improving.   Patient contacted by phone and made aware of    results. Pt verbalized understanding and had all questions answered.    

## 2019-03-11 ENCOUNTER — Telehealth: Payer: Self-pay | Admitting: *Deleted

## 2019-03-11 NOTE — Telephone Encounter (Signed)
Received fax from [facility] re: dimethyl fumarate DR 240 mg  shipped on 03/01/2019.

## 2019-05-06 ENCOUNTER — Other Ambulatory Visit: Payer: Self-pay | Admitting: Diagnostic Neuroimaging

## 2019-05-06 ENCOUNTER — Encounter: Payer: Self-pay | Admitting: *Deleted

## 2019-05-06 DIAGNOSIS — G35 Multiple sclerosis: Secondary | ICD-10-CM

## 2019-05-06 NOTE — Telephone Encounter (Signed)
Received refill request for Tecfidera. Refill refused. Patient last seen Sept 2019. Sent him a my chart requesting he call and schedule FU; advised he must be seen at least yearly to continue to get refills.

## 2019-05-30 ENCOUNTER — Other Ambulatory Visit: Payer: Self-pay | Admitting: Diagnostic Neuroimaging

## 2019-05-30 DIAGNOSIS — G35 Multiple sclerosis: Secondary | ICD-10-CM

## 2019-05-30 NOTE — Telephone Encounter (Signed)
Called patient and LVM advising him I have scheduled him for FU, gave date, time of arrival. I advised we received refill request on Tecfiera, so I am giving one month supply. He must come for FU. Left office number.

## 2019-06-10 ENCOUNTER — Other Ambulatory Visit: Payer: Self-pay | Admitting: Diagnostic Neuroimaging

## 2019-06-10 DIAGNOSIS — G35 Multiple sclerosis: Secondary | ICD-10-CM

## 2019-06-25 ENCOUNTER — Other Ambulatory Visit: Payer: Self-pay

## 2019-06-25 ENCOUNTER — Encounter: Payer: Self-pay | Admitting: Diagnostic Neuroimaging

## 2019-06-25 ENCOUNTER — Ambulatory Visit: Payer: BC Managed Care – PPO | Admitting: Diagnostic Neuroimaging

## 2019-06-25 VITALS — BP 104/64 | HR 76 | Temp 97.6°F | Ht 67.0 in | Wt 132.4 lb

## 2019-06-25 DIAGNOSIS — G35 Multiple sclerosis: Secondary | ICD-10-CM

## 2019-06-25 NOTE — Progress Notes (Signed)
[facility]  PATIENT: Shane Rollins DOB: 1976-02-11  REFERRING CLINICIAN:  HISTORY FROM: patient REASON FOR VISIT: follow up   HISTORICAL  CHIEF COMPLAINT:  Chief Complaint  Patient presents with  . Multiple Sclerosis    rm 7 FU, "joints in hands, wrists and neck sometimes ache; no vision changes at all"    HISTORY OF PRESENT ILLNESS:   UPDATE (06/25/19, VRP): Since last visit, doing well. Symptoms are stable. Severity is mild. No alleviating or aggravating factors. Tolerating tecfidera. Has had covid vaccine and doing well.   UPDATE (12/04/17, VRP): Since last visit, doing well. Symptoms are stable. Severity is mild. No alleviating or aggravating factors. Tolerating tecfidera. Lost to follow up due to financial issues. No numbness or tingling. Fatigue fluctuates.  UPDATE 11/30/15: Since last visit, doing about the same. Some intermittent urinary frequency/urgency and incomplete bladder emptying sensation. No pain or dysuria.   UPDATE 07/30/15: Since last visit, doing well. No new major issues. Some intermittent numbness. Some intermittent urinary frequency issues.  UPDATE 07/15/14: Since last visit, doing well. No new events. Int facial numbness continues. Still with some left hand/wrist/finger joint pains.  UPDATE 04/14/14: Since last visit, has had some intermittent facial numbness, night sweats, joint pain. Also with increasing, nagging headaches. He notes that he skips lunch most days. He was drinking Etoh 1-2 drinks per night, but recently quit, to see if HA would improve, but thery haven't changed much.   UPDATE 08/29/13: Since last visit doing well. Tolerating tecfidera. 1 month ago had 1 week of night sweats, with sinus pressure and 1 day of fever. Some intermittent headaches with frontal sinus pressure. No nausea, vomiting, photphobia or phonophobia.   UPDATE 05/24/13: Since last visit, vision continue to improve. Now able to drive without eye patch. Some occ  double vision with extreme right gaze. Also with intermittent, brief numbness in face, burning pains in left shoulder/scapula. Overall tolerating tecfidera (started 04/24/13), with no sig side effects. Taking meds with foods. Occ "quesey" stomach, but not nausea/vomiting/diarrhea.  UPDATE 04/10/13: Since last visit, right eye has been slightly improving. No new symptoms. Here to review test results, diagnosis, and treatment options.   UPDATE 03/04/13: Since last visit, symptoms stable. MRI results reviewed. No new events.  PRIOR HPI (02/19/13): 44 year old right-handed male here for evaluation of double vision. 2 weeks ago patient sudden onset double vision when looking to the right side. 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 Symptoms have been stable since that time. When patient covers one eye he does not have double vision. Symptoms are worse when he looks to the right or when he is outdoors. No prodromal illness, infection, cough, cold, change in diet or exercise. No traumas. In September an episode of left arm and left leg tingling sensation lasting for several hours. Patient had intermittent headaches and dizziness.   REVIEW OF SYSTEMS: Full 14 system review of systems performed and negative except: freq urination fatigue.      ALLERGIES: Allergies  Allergen Reactions  . Codeine Nausea And Vomiting    HOME MEDICATIONS: Outpatient Encounter Medications as of 06/25/2019  Medication Sig  . ibuprofen (ADVIL,MOTRIN) 800 MG tablet Take 800 mg by mouth every 6 (six) hours as needed. for pain  . Multiple Vitamin (MULTIVITAMIN) tablet Take 1 tablet by mouth daily.  . Probiotic Product (PROBIOTIC-10 PO) Take by mouth.  . TECFIDERA 240 MG CPDR TAKE ONE CAPSULE BY MOUTH TWICE DAILY.  STORE IN ORIGINAL CONTAINER AT ROOM TEMPERATURE.   No facility-administered encounter medications on file as of 06/25/2019.    PAST MEDICAL HISTORY: Past Medical History:  Diagnosis  Date  . Multiple sclerosis (HCC)    PAST SURGICAL HISTORY: No past surgical history on file.  FAMILY HISTORY: Family History  Problem Relation Age of Onset  . Healthy Mother   . Congestive Heart Failure Other   . Depression Other     SOCIAL HISTORY:  Social History   Socioeconomic History  . Marital status: Single    Spouse name: Not on file  . Number of children: 0  . Years of education: BA  . 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 Smoking status: Former Smoker    Packs/day: 0.50    Years: 12.00    Pack years: 6.00    Types: Cigarettes    Quit date: 03/15/2007    Years since quitting: 12.2  . Smokeless tobacco: Never Used  Substance and Sexual Activity  . Alcohol use: Yes    Comment: 2 drinks weekly  . Drug use: No  . Sexual activity: Not on file  Other Topics Concern  . Not on file  Social History Narrative   Patient lives at home alone.   Caffeine Use: 1 cup daily   Social Determinants of Health   Financial Resource Strain:   . Difficulty of Paying Living Expenses:   Food Insecurity:   . Worried About Programme researcher, broadcasting/film/video in the Last Year:   . Barista in the Last Year:   Transportation Needs:   . Freight forwarder (Medical):   Marland Kitchen Lack of Transportation (Non-Medical):   Physical Activity:   . Days of Exercise per Week:   . Minutes of Exercise per Session:   Stress:   . Feeling of Stress :   Social Connections:   . Frequency of Communication with Friends and Family:   . Frequency of Social Gatherings with Friends and Family:   . Attends Religious Services:   . Active Member of Clubs or Organizations:   . Attends Banker Meetings:   Marland Kitchen Marital Status:   Intimate Partner Violence:   . Fear of Current or Ex-Partner:   . Emotionally Abused:   Marland Kitchen Physically Abused:   . Sexually Abused:      PHYSICAL EXAM  Vitals:    06/25/19 0823  BP: 104/64  Pulse: 76  Temp: 97.6 F (36.4 C)  Weight: 132 lb 6.4 oz (60.1 kg)  Height: 5\' 7"  (1.702 m)    Not recorded     Wt Readings from Last 3 Encounters:  06/25/19 132 lb 6.4 oz (60.1 kg)  12/04/17 134 lb 9.6 oz (61.1 kg)  11/30/15 133 lb 6.4 oz (60.5 kg)   Body mass index is 20.74 kg/m.  GENERAL EXAM: Patient is in no distress; well developed, nourished and groomed; neck is supple  CARDIOVASCULAR: Regular rate and rhythm, no murmurs, no carotid bruits  NEUROLOGIC: MENTAL STATUS: awake, alert, language fluent, comprehension intact, naming intact, fund of knowledge appropriate CRANIAL NERVE: pupils equal and reactive to light, visual fields full to confrontation, extraocular muscles intact, no nystagmus, facial sensation and strength symmetric, hearing intact, palate elevates symmetrically, uvula midline, shoulder shrug symmetric, tongue midline. MOTOR: normal bulk and tone, full strength in the BUE, BLE SENSORY: normal and symmetric to  light touch COORDINATION: finger-nose-finger, fine finger movements normal REFLEXES: deep tendon reflexes present and symmetric GAIT/STATION: narrow based gait    DIAGNOSTIC DATA (LABS, IMAGING, TESTING) - I reviewed patient records, labs, notes, testing and imaging myself where available.  Lab Results  Component Value Date   WBC 6.0 07/30/2015   HGB 13.2 07/30/2015   HCT 40.2 07/30/2015   MCV 95 07/30/2015   PLT 259 07/30/2015      Component Value Date/Time   NA 142 07/30/2015 1703   K 4.0 07/30/2015 1703   CL 102 07/30/2015 1703   CO2 21 07/30/2015 1703   GLUCOSE 83 07/30/2015 1703   GLUCOSE 137 (H) 10/24/2010 2234   BUN 7 07/30/2015 1703   CREATININE 0.73 (L) 07/30/2015 1703   CALCIUM 9.5 07/30/2015 1703   PROT 7.4 07/30/2015 1703   ALBUMIN 4.9 07/30/2015 1703   AST 24 07/30/2015 1703   ALT 15 07/30/2015 1703   ALKPHOS 84 07/30/2015 1703   BILITOT 0.5 07/30/2015 1703   GFRNONAA 118 07/30/2015 1703    GFRAA 137 07/30/2015 1703   No results found for: CHOL Lab Results  Component Value Date   HGBA1C 5.6 02/19/2013   Lab Results  Component Value Date   WVPXTGGY69 485 07/30/2015   Lab Results  Component Value Date   TSH 1.320 02/19/2013   Vit D, 25-Hydroxy  Date Value Ref Range Status  07/30/2015 27.0 (L) 30.0 - 100.0 ng/mL Final    Comment:    Vitamin D deficiency has been defined by the [place] practice guideline as a level of serum 25-OH vitamin D less than 20 ng/mL (1,2). The Endocrine Society went on to further define vitamin D insufficiency as a level between 21 and 29 ng/mL (2). 1. IOM (Institute of Medicine). 2010. Dietary reference    intakes for calcium and D. [place]: The    [employer]. 2. Holick MF, Binkley [place], Bischoff-Ferrari HA, et al.    Evaluation, treatment, and prevention of vitamin D    deficiency: an Endocrine Society clinical practice    guideline. JCEM. 2011 Jul; 96(7):1911-30.   08/29/2013 45.8 30.0 - 100.0 ng/mL Final    Comment:    Vitamin D deficiency has been defined by the [place] practice guideline as a level of serum 25-OH vitamin D less than 20 ng/mL (1,2). The Endocrine Society went on to further define vitamin D insufficiency as a level between 21 and 29 ng/mL (2). 1. IOM (Institute of Medicine). 2010. Dietary reference    intakes for calcium and D. [place]: The    [employer]. 2. Holick MF, Binkley [place], Bischoff-Ferrari HA, et al.    Evaluation, treatment, and prevention of vitamin D    deficiency: an Endocrine Society clinical practice    guideline. JCEM. 2011 Jul; 96(7):1911-30.  05/24/2013 12.9 (L) 30.0 - 100.0 ng/mL Final    Comment:    Vitamin D deficiency has been defined by the [place] practice guideline as a level of serum 25-OH vitamin D less than 20 ng/mL (1,2). The  Endocrine Society went on to further define vitamin D insufficiency as a level between 21 and 29 ng/mL (2). 1. IOM (Institute of Medicine). 2010. Dietary reference    intakes for calcium and D. [place]: The    [employer]. 2. Holick MF, Binkley [place], Bischoff-Ferrari HA, et al.    Evaluation, treatment,  and prevention of vitamin D    deficiency: an Endocrine Society clinical practice    guideline. JCEM. 2011 Jul; 96(7):1911-30.    03/25/13 MRI cervical  - Right anterior T2 hyperintense spinal cord lesion from C2 to C3-4 level, measuring 3.0cm in cranio-caudal extent, and 0.4cm in antero-posterior extent.  - Right ponto-medullary T2 hyperintense lesion.  - Findings suspicious for chronic demyelinating disease. Other considerations include autoimmune, inflammatory, post-infectious or ischemic etiologies.  - No acute findings.  03/25/13 MRI thoracic - normal  05/15/14 MRI brain -  1. There are multiple periventricular, subcortical, juxtacortical, midbrain, pontine chronic demyelinating plaques. No acute plaques.  2. Compared to MRI on 09/04/13, there is no significant change.  04/29/18 MRI brain (with and without) demonstrating: - Few supratentorial and infratentorial chronic demyelinating plaques. - No acute plaques. - No change from MRI on 05/15/14.    03/04/13 Labs (B12, NMO ab, RPR, Hep B, Hep C, HIV, ANCA, ANA, Tb) - all negative/normal  03/04/13 JCV antibody - POSITIVE 1.95 (H)   ASSESSMENT AND PLAN  44 y.o. year old male here with sudden onset double vision in Nov/Dec 2014. MRI brain and c-spine consistent with multiple sclerosis. MS lab mimics were negative. Now on Tecfidera since 04/24/13, and doing well.  Dx: multiple sclerosis  MS (multiple sclerosis) (HCC) - Plan: CBC with Differential/Platelet, Comprehensive metabolic panel, VITAMIN D 25 Hydroxy (Vit-D Deficiency, Fractures)    PLAN:  MULTIPLE SCLEROSIS (stable, on tecfidera) - continue tecfidera -  check labs  - continue multi-vitamin, vitamin D 1000 units daily  Orders Placed This Encounter  Procedures  . CBC with Differential/Platelet  . Comprehensive metabolic panel  . VITAMIN D 25 Hydroxy (Vit-D Deficiency, Fractures)   Return in about 9 months (around 03/26/2020).    Suanne Marker, MD 06/25/2019, 8:47 AM Certified in Neurology, Neurophysiology and Neuroimaging  [facility] [address] [place], [place] [address] [phone]

## 2019-06-26 LAB — CBC WITH DIFFERENTIAL/PLATELET
Basophils Absolute: 0 10*3/uL (ref 0.0–0.2)
Basos: 1 %
EOS (ABSOLUTE): 0.1 10*3/uL (ref 0.0–0.4)
Eos: 1 %
Hematocrit: 38.9 % (ref 37.5–51.0)
Hemoglobin: 13.1 g/dL (ref 13.0–17.7)
Immature Grans (Abs): 0 10*3/uL (ref 0.0–0.1)
Immature Granulocytes: 0 %
Lymphocytes Absolute: 1.4 10*3/uL (ref 0.7–3.1)
Lymphs: 20 %
MCH: 32.5 pg (ref 26.6–33.0)
MCHC: 33.7 g/dL (ref 31.5–35.7)
MCV: 97 fL (ref 79–97)
Monocytes Absolute: 0.5 10*3/uL (ref 0.1–0.9)
Monocytes: 7 %
Neutrophils Absolute: 4.9 10*3/uL (ref 1.4–7.0)
Neutrophils: 71 %
Platelets: 288 10*3/uL (ref 150–450)
RBC: 4.03 x10E6/uL — ABNORMAL LOW (ref 4.14–5.80)
RDW: 11.8 % (ref 11.6–15.4)
WBC: 6.9 10*3/uL (ref 3.4–10.8)

## 2019-06-26 LAB — COMPREHENSIVE METABOLIC PANEL
ALT: 21 IU/L (ref 0–44)
AST: 27 IU/L (ref 0–40)
Albumin/Globulin Ratio: 2.4 — ABNORMAL HIGH (ref 1.2–2.2)
Albumin: 5 g/dL (ref 4.0–5.0)
Alkaline Phosphatase: 85 IU/L (ref 39–117)
BUN/Creatinine Ratio: 12 (ref 9–20)
BUN: 9 mg/dL (ref 6–24)
Bilirubin Total: 0.3 mg/dL (ref 0.0–1.2)
CO2: 23 mmol/L (ref 20–29)
Calcium: 9.5 mg/dL (ref 8.7–10.2)
Chloride: 107 mmol/L — ABNORMAL HIGH (ref 96–106)
Creatinine, Ser: 0.77 mg/dL (ref 0.76–1.27)
GFR calc Af Amer: 130 mL/min/{1.73_m2} (ref >59)
GFR calc non Af Amer: 113 mL/min/{1.73_m2} (ref >59)
Globulin, Total: 2.1 g/dL (ref 1.5–4.5)
Glucose: 96 mg/dL (ref 65–99)
Potassium: 4.1 mmol/L (ref 3.5–5.2)
Sodium: 143 mmol/L (ref 134–144)
Total Protein: 7.1 g/dL (ref 6.0–8.5)

## 2019-06-26 LAB — VITAMIN D 25 HYDROXY (VIT D DEFICIENCY, FRACTURES): Vit D, 25-Hydroxy: 33.5 ng/mL (ref 30.0–100.0)

## 2019-06-27 ENCOUNTER — Telehealth: Payer: Self-pay | Admitting: *Deleted

## 2019-06-27 NOTE — Telephone Encounter (Signed)
Spoke with patient and informed him his labs look good.  Patient verbalized understanding, appreciation.

## 2019-07-01 ENCOUNTER — Other Ambulatory Visit: Payer: Self-pay | Admitting: Diagnostic Neuroimaging

## 2019-07-01 DIAGNOSIS — G35 Multiple sclerosis: Secondary | ICD-10-CM

## 2019-07-08 ENCOUNTER — Telehealth: Payer: Self-pay | Admitting: *Deleted

## 2019-07-08 NOTE — Telephone Encounter (Signed)
[facility] Tecf[facility]a [facility] 240 mg shipped on 07/02/2019.

## 2020-01-30 ENCOUNTER — Telehealth: Payer: Self-pay | Admitting: *Deleted

## 2020-01-30 NOTE — Telephone Encounter (Signed)
Received fax from [facility]: dimethyl fumarate PA. It is on preferred drug list. PA on MD's desk for signature.

## 2020-02-26 ENCOUNTER — Telehealth: Payer: Self-pay | Admitting: *Deleted

## 2020-02-26 NOTE — Telephone Encounter (Signed)
Faxed completed, signed dimethyl fumarate PA to [facility]. Received confirmation.

## 2020-02-26 NOTE — Telephone Encounter (Signed)
Received PA form for dimethyl fumarate which is a preferred drug with his insurance. On MD desk for signature.

## 2020-03-04 ENCOUNTER — Telehealth: Payer: Self-pay | Admitting: Diagnostic Neuroimaging

## 2020-03-04 DIAGNOSIS — G35 Multiple sclerosis: Secondary | ICD-10-CM

## 2020-03-04 NOTE — Telephone Encounter (Signed)
I returned the call to [facility] who states the PA is still pending. They requested the date the PA was faxed to insurance for dimethyl fumarate.   Per previous note, it was faxed on 02/26/20:  Maryland Pink, RN  02/26/20 5:25 PM  Faxed completed, signed dimethyl fumarate PA to [facility]. Received confirmation.      The pharmacy will continue to monitor the prescription for approval.

## 2020-03-04 NOTE — Telephone Encounter (Signed)
Serika from [facility] stating the she is needing to speak to someone about the pt's TECFIDERA 240 MG CPDR She did not leave any other information.

## 2020-03-05 NOTE — Telephone Encounter (Signed)
Started brand Tecfidera PA on CMM, key: [id number]. [facility] is processing your PA request and will respond shortly with next steps. You are currently using the fastest method to process this prior authorization.

## 2020-03-05 NOTE — Telephone Encounter (Signed)
Clinical questions answered on CMM. Your information has been submitted to [facility]. To check for an updated outcome later, reopen this PA request from your dashboard.  If [facility] has not responded to your request within 24 hours, contact [facility] at [phone]. If you think there may be a problem with your PA request, use our live chat feature at the bottom right.

## 2020-03-05 NOTE — Telephone Encounter (Signed)
[facility] to check PA on dimethyl fumarate, spoke with Eunice Blase who stated they only have brand tecfidera on file for patient. Tecfidera was last refill shipped out, not dimethyl fumarate. I advised her Dec 2020  [facility] f[facility]ate. She transferred call to pharmacist, [place] who stated patient has never been on generic; last Rx on file is DAW Tecfidera. She stated PA needed for brand, call p#[phone], opt 1, opt 4. Spoke with Sigele who will initiate PA on cover my meds. Key -[id number] Called patient to update, LVM requesting call back before noon today.

## 2020-03-05 NOTE — Telephone Encounter (Signed)
Your demographic data has been sent to [facility] successfully! [facility] typically takes 5-10 minutes to respond, but it may take a little longer in some cases. You will be notified by email when available. You can also check for an update later by opening this request from your dashboard.  If it has been longer than 24 hours, please reach out to [facility].

## 2020-03-09 MED ORDER — DIMETHYL FUMARATE 240 MG PO CPDR: 240.0000 mg | DELAYED_RELEASE_CAPSULE | Freq: Two times a day (BID) | ORAL | 11 refills | Status: DC

## 2020-03-09 NOTE — Telephone Encounter (Addendum)
Per Dr Marjory Lies, patient switched to dimethyl fumarate. [facility] verbal order. Called [facility], spoke with Tresa Endo to do PA.  Approval already on file effective 02/27/2020 through 02/26/2021. [facility] #  [phone] to expedite delivery of new Rx. Spoke with Darel Hong to expedite Rx; she noted he ran out 3 days ago. Once approved by pharmacist they'll call him to schedule delivery, should be mailed tomorrow.  Called patient and advised him of above. Patient verbalized understanding, appreciation.

## 2020-03-09 NOTE — Telephone Encounter (Addendum)
Tecfidera is denied, he must try/fail dimethyl fumarate and two other preferred medications. Called patient who stated [facility] was [facility]. He stated he [facility] preferred drugs and is wiling to try dimethyl fumarate; his last dose of Tecfidera was 03/06/20. I advised will let Dr Marjory Lies know a new Rx is needed. PA will still have to be done. Patient verbalized understanding, appreciation.

## 2020-03-09 NOTE — Addendum Note (Signed)
Addended by: [place] Pink on: 03/09/2020 02:13 PM   Modules accepted: Orders

## 2020-03-18 NOTE — Telephone Encounter (Signed)
Noted; patient is switched to dimethyl fumarate which is approved. No appeal needed for Tecfidera.

## 2020-03-18 NOTE — Telephone Encounter (Signed)
Vonna Kotyk from CoverMyMeds called to advise the PA for Shane Rollins is denied. If needed, it can be appealed. Key Ref: [id number]  Best contact number: [phone]

## 2020-04-23 ENCOUNTER — Telehealth: Payer: Self-pay | Admitting: Diagnostic Neuroimaging

## 2020-04-23 DIAGNOSIS — G35 Multiple sclerosis: Secondary | ICD-10-CM

## 2020-04-23 MED ORDER — DIMETHYL FUMARATE 240 MG PO CPDR: 240.0000 mg | DELAYED_RELEASE_CAPSULE | Freq: Two times a day (BID) | ORAL | 11 refills | Status: DC

## 2020-04-23 NOTE — Telephone Encounter (Addendum)
Prescription refilled; patient has FU in April. Called patient and advised him refill sent in. Patient verbalized understanding, appreciation.

## 2020-04-23 NOTE — Telephone Encounter (Signed)
Pt called wanting to know if he can get his Tecfidera called in for a refill at the [facility].

## 2020-06-24 ENCOUNTER — Telehealth: Payer: Self-pay | Admitting: *Deleted

## 2020-06-24 ENCOUNTER — Ambulatory Visit: Payer: BC Managed Care – PPO | Admitting: Diagnostic Neuroimaging

## 2020-06-24 ENCOUNTER — Encounter: Payer: Self-pay | Admitting: Diagnostic Neuroimaging

## 2020-06-24 NOTE — Telephone Encounter (Signed)
Patient was no show for follow up appointment today.  

## 2020-08-03 ENCOUNTER — Ambulatory Visit: Payer: BC Managed Care – PPO | Admitting: Diagnostic Neuroimaging

## 2020-08-18 ENCOUNTER — Ambulatory Visit: Payer: BC Managed Care – PPO | Admitting: Diagnostic Neuroimaging

## 2020-08-18 ENCOUNTER — Encounter: Payer: Self-pay | Admitting: Diagnostic Neuroimaging

## 2020-08-18 VITALS — BP 105/65 | HR 67 | Ht 67.0 in | Wt 137.2 lb

## 2020-08-18 DIAGNOSIS — G35 Multiple sclerosis: Secondary | ICD-10-CM

## 2020-08-18 NOTE — Progress Notes (Signed)
[facility]  PATIENT: Shane Rollins DOB: [birth year]  REFERRING CLINICIAN:  HISTORY FROM: patient REASON FOR VISIT: follow up   HISTORICAL  CHIEF COMPLAINT:  Chief Complaint  Patient presents with  . Multiple Sclerosis    Rm 6, one year FU "doing well"    HISTORY OF PRESENT ILLNESS:   UPDATE (08/18/20, VRP): Since last visit, doing well. Symptoms are stable. Severity is mild. No alleviating or aggravating factors. Tolerating generic tecfidera.    UPDATE (06/25/19, VRP): Since last visit, doing well. Symptoms are stable. Severity is mild. No alleviating or aggravating factors. Tolerating tecfidera. Has had covid vaccine and doing well.   UPDATE (12/04/17, VRP): Since last visit, doing well. Symptoms are stable. Severity is mild. No alleviating or aggravating factors. Tolerating tecfidera. Lost to follow up due to financial issues. No numbness or tingling. Fatigue fluctuates.  UPDATE 11/30/15: Since last visit, doing about the same. Some intermittent urinary frequency/urgency and incomplete bladder emptying sensation. No pain or dysuria.   UPDATE 07/30/15: Since last visit, doing well. No new major issues. Some intermittent numbness. Some intermittent urinary frequency issues.  UPDATE 07/15/14: Since last visit, doing well. No new events. Int facial numbness continues. Still with some left hand/wrist/finger joint pains.  UPDATE 04/14/14: Since last visit, has had some intermittent facial numbness, night sweats, joint pain. Also with increasing, nagging headaches. He notes that he skips lunch most days. He was drinking Etoh 1-2 drinks per night, but recently quit, to see if HA would improve, but thery haven't changed much.   UPDATE 08/29/13: Since last visit doing well. Tolerating tecfidera. 1 month ago had 1 week of night sweats, with sinus pressure and 1 day of fever. Some intermittent headaches with frontal sinus pressure. No nausea, vomiting, photphobia or phonophobia.    UPDATE 05/24/13: Since last visit, vision continue to improve. Now able to drive without eye patch. Some occ double vision with extreme right gaze. Also with intermittent, brief numbness in face, burning pains in left shoulder/scapula. Overall tolerating tecfidera (started 04/24/13), with no sig side effects. Taking meds with foods. Occ "quesey" stomach, but not nausea/vomiting/diarrhea.  UPDATE 04/10/13: Since last visit, right eye has been slightly improving. No new symptoms. Here to review test results, diagnosis, and treatment options.   UPDATE 03/04/13: Since last visit, symptoms stable. MRI results reviewed. No new events.  PRIOR HPI (02/19/13): 44 year old right-handed male here for evaluation of double vision. 2 weeks ago patient sudden onset double vision when looking to the right side. 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 Symptoms have been stable since that time. When patient covers one eye he does not have double vision. Symptoms are worse when he looks to the right or when he is outdoors. No prodromal illness, infection, cough, cold, change in diet or exercise. No traumas. In September an episode of left arm and left leg tingling sensation lasting for several hours. Patient had intermittent headaches and dizziness.   REVIEW OF SYSTEMS: Full 14 system review of systems performed and negative except: freq urination fatigue.      ALLERGIES: Allergies  Allergen Reactions  . Codeine Nausea And Vomiting    HOME MEDICATIONS: Outpatient Encounter Medications as of 08/18/2020  Medication Sig  . Dimethyl Fumarate 240 MG CPDR Take 1 capsule (240 mg total) by mouth 2 (two) times daily.  Marland Kitchen ibuprofen (ADVIL,MOTRIN) 800 MG tablet Take 800 mg by mouth every 6 (six) hours as needed. for  pain  . Multiple Vitamin (MULTIVITAMIN) tablet Take 1 tablet by mouth daily.  . Probiotic Product (PROBIOTIC-10 PO) Take by mouth.   No facility-administered encounter medications  on file as of 08/18/2020.    PAST MEDICAL HISTORY: Past Medical History:  Diagnosis Date  . Multiple sclerosis (HCC)    PAST SURGICAL HISTORY: No past surgical history on file.  FAMILY HISTORY: Family History  Problem Relation Age of Onset  . Healthy Mother   . Congestive Heart Failure Other   . Depression Other     SOCIAL HISTORY:  Social History   Socioeconomic History  . Marital status: Single    Spouse name: Not on file  . Number of children: 0  . Years of education: BA  . 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 Smoking status: Former Smoker    Packs/day: 0.50    Years: 12.00    Pack years: 6.00    Types: Cigarettes    Quit date: 03/15/2007    Years since quitting: 13.4  . Smokeless tobacco: Never Used  Substance and Sexual Activity  . Alcohol use: Yes    Comment: 2 drinks weekly  . Drug use: No  . Sexual activity: Not on file  Other Topics Concern  . Not on file  Social History Narrative   Patient lives at home alone.   Caffeine Use: 1 cup daily   Social Determinants of Health   Financial Resource Strain: Not on file  Food Insecurity: Not on file  Transportation Needs: Not on file  Physical Activity: Not on file  Stress: Not on file  Social Connections: Not on file  Intimate Partner Violence: Not on file     PHYSICAL EXAM  Vitals:   08/18/20 1616  BP: 105/65  Pulse: 67  Weight: 137 lb 3.2 oz (62.2 kg)  Height: 5\' 7"  (1.702 m)   Not recorded    Wt Readings from Last 3 Encounters:  08/18/20 137 lb 3.2 oz (62.2 kg)  06/25/19 132 lb 6.4 oz (60.1 kg)  12/04/17 134 lb 9.6 oz (61.1 kg)   Body mass index is 21.49 kg/m.  GENERAL EXAM: Patient is in no distress; well developed, nourished and groomed; neck is supple  CARDIOVASCULAR: Regular rate and rhythm, no murmurs, no carotid bruits  NEUROLOGIC: MENTAL STATUS: awake, alert, language  fluent, comprehension intact, naming intact, fund of knowledge appropriate CRANIAL NERVE: pupils equal and reactive to light, visual fields full to confrontation, extraocular muscles intact, no nystagmus, facial sensation and strength symmetric, hearing intact, palate elevates symmetrically, uvula midline, shoulder shrug symmetric, tongue midline. MOTOR: normal bulk and tone, full strength in the BUE, BLE SENSORY: normal and symmetric to light touch COORDINATION: finger-nose-finger, fine finger movements normal REFLEXES: deep tendon reflexes present and symmetric GAIT/STATION: narrow based gait    DIAGNOSTIC DATA (LABS, IMAGING, TESTING) - I reviewed patient records, labs, notes, testing and imaging myself where available.  Lab Results  Component Value Date   WBC 6.9 06/25/2019   HGB 13.1 06/25/2019   HCT 38.9 06/25/2019   MCV 97 06/25/2019   PLT 288 06/25/2019      Component Value Date/Time   NA 143 06/25/2019 0908   K 4.1 06/25/2019 0908   CL 107 (H) 06/25/2019 0908   CO2 23 06/25/2019 0908   GLUCOSE 96 06/25/2019 0908   GLUCOSE 137 (H) 10/24/2010 2234  BUN 9 06/25/2019 0908   CREATININE 0.77 06/25/2019 0908   CALCIUM 9.5 06/25/2019 0908   PROT 7.1 06/25/2019 0908   ALBUMIN 5.0 06/25/2019 0908   AST 27 06/25/2019 0908   ALT 21 06/25/2019 0908   ALKPHOS 85 06/25/2019 0908   BILITOT 0.3 06/25/2019 0908   GFRNONAA 113 06/25/2019 0908   GFRAA 130 06/25/2019 0908   No results found for: CHOL Lab Results  Component Value Date   HGBA1C 5.6 02/19/2013   Lab Results  Component Value Date   VITAMINB12 357 07/30/2015   Lab Results  Component Value Date   TSH 1.320 02/19/2013   Vit D, 25-Hydroxy  Date Value Ref Range Status  06/25/2019 33.5 30.0 - 100.0 ng/mL Final    Comment:    Vitamin D deficiency has been defined by the Institute of Medicine and an Endocrine Society practice guideline as a level of serum 25-OH vitamin D less than 20 ng/mL (1,2). The Endocrine  Society went on to further define vitamin D insufficiency as a level between 21 and 29 ng/mL (2). 1. IOM (Institute of Medicine). 2010. Dietary reference    intakes for calcium and D. [place]: The    [employer]. 2. Holick MF, Binkley [place], Bischoff-Ferrari HA, et al.    Evaluation, treatment, and prevention of vitamin D    deficiency: an Endocrine Society clinical practice    guideline. JCEM. 2011 Jul; 96(7):1911-30.   07/30/2015 27.0 (L) 30.0 - 100.0 ng/mL Final    Comment:    Vitamin D deficiency has been defined by the Institute of Medicine and an Endocrine Society practice guideline as a level of serum 25-OH vitamin D less than 20 ng/mL (1,2). The Endocrine Society went on to further define vitamin D insufficiency as a level between 21 and 29 ng/mL (2). 1. IOM (Institute of Medicine). 2010. Dietary reference    intakes for calcium and D. [place]: The    [employer]. 2. Holick MF, Binkley [place], Bischoff-Ferrari HA, et al.    Evaluation, treatment, and prevention of vitamin D    deficiency: an Endocrine Society clinical practice    guideline. JCEM. 2011 Jul; 96(7):1911-30.   08/29/2013 45.8 30.0 - 100.0 ng/mL Final    Comment:    Vitamin D deficiency has been defined by the Institute of Medicine and an Endocrine Society practice guideline as a level of serum 25-OH vitamin D less than 20 ng/mL (1,2). The Endocrine Society went on to further define vitamin D insufficiency as a level between 21 and 29 ng/mL (2). 1. IOM (Institute of Medicine). 2010. Dietary reference    intakes for calcium and D. [place]: The    [employer]. 2. Holick MF, Binkley [place], Bischoff-Ferrari HA, et al.    Evaluation, treatment, and prevention of vitamin D    deficiency: an Endocrine Society clinical practice    guideline. JCEM. 2011 Jul; 96(7):1911-30.    03/25/13 MRI cervical  - Right anterior T2 hyperintense spinal cord lesion from C2 to C3-4  level, measuring 3.0cm in cranio-caudal extent, and 0.4cm in antero-posterior extent.  - Right ponto-medullary T2 hyperintense lesion.  - Findings suspicious for chronic demyelinating disease. Other considerations include autoimmune, inflammatory, post-infectious or ischemic etiologies.  - No acute findings.  03/25/13 MRI thoracic - normal  05/15/14 MRI brain -  1. There are multiple periventricular, subcortical, juxtacortical, midbrain, pontine chronic demyelinating plaques. No acute plaques.  2. Compared to MRI on 09/04/13, there is no significant change.  04/29/18 MRI brain (with and without) demonstrating: - Few supratentorial and infratentorial chronic demyelinating plaques. - No acute plaques. - No change from MRI on 05/15/14.    03/04/13 Labs (B12, NMO ab, RPR, Hep B, Hep C, HIV, ANCA, ANA, Tb) - all negative/normal  03/04/13 JCV antibody - POSITIVE 1.95 (H)   ASSESSMENT AND PLAN  45 y.o. year old male here with sudden onset double vision in Nov/Dec 2014. MRI brain and c-spine consistent with multiple sclerosis. MS lab mimics were negative. Now on Tecfidera since 04/24/13, and doing well.  Dx: multiple sclerosis  MS (multiple sclerosis) (HCC) - Plan: MR BRAIN W WO CONTRAST, CBC with Differential/Platelet, Comprehensive metabolic panel, VITAMIN D 25 Hydroxy (Vit-D Deficiency, Fractures)    PLAN:  MULTIPLE SCLEROSIS (stable, on tecfidera) - continue tecfidera - check labs  - continue multi-vitamin, vitamin D 1000 units daily  Orders Placed This Encounter  Procedures  . MR BRAIN W WO CONTRAST  . CBC with Differential/Platelet  . Comprehensive metabolic panel  . VITAMIN D 25 Hydroxy (Vit-D Deficiency, Fractures)   Return in about 1 year (around 08/18/2021).    Suanne Marker, MD 08/18/2020, 4:43 PM Certified in Neurology, Neurophysiology and Neuroimaging  [facility] [address], [address] [place], [place] [address] [phone]

## 2020-08-19 LAB — CBC WITH DIFFERENTIAL/PLATELET
Basophils Absolute: 0 10*3/uL (ref 0.0–0.2)
Basos: 1 %
EOS (ABSOLUTE): 0.1 10*3/uL (ref 0.0–0.4)
Eos: 2 %
Hematocrit: 39 % (ref 37.5–51.0)
Hemoglobin: 13.3 g/dL (ref 13.0–17.7)
Immature Grans (Abs): 0 10*3/uL (ref 0.0–0.1)
Immature Granulocytes: 0 %
Lymphocytes Absolute: 1.7 10*3/uL (ref 0.7–3.1)
Lymphs: 33 %
MCH: 32.1 pg (ref 26.6–33.0)
MCHC: 34.1 g/dL (ref 31.5–35.7)
MCV: 94 fL (ref 79–97)
Monocytes Absolute: 0.4 10*3/uL (ref 0.1–0.9)
Monocytes: 8 %
Neutrophils Absolute: 2.9 10*3/uL (ref 1.4–7.0)
Neutrophils: 56 %
Platelets: 252 10*3/uL (ref 150–450)
RBC: 4.14 x10E6/uL (ref 4.14–5.80)
RDW: 12.3 % (ref 11.6–15.4)
WBC: 5.3 10*3/uL (ref 3.4–10.8)

## 2020-08-19 LAB — COMPREHENSIVE METABOLIC PANEL
ALT: 23 IU/L (ref 0–44)
AST: 28 IU/L (ref 0–40)
Albumin/Globulin Ratio: 2 (ref 1.2–2.2)
Albumin: 4.9 g/dL (ref 4.0–5.0)
Alkaline Phosphatase: 89 IU/L (ref 44–121)
BUN/Creatinine Ratio: 9 (ref 9–20)
BUN: 7 mg/dL (ref 6–24)
Bilirubin Total: 0.4 mg/dL (ref 0.0–1.2)
CO2: 24 mmol/L (ref 20–29)
Calcium: 9.3 mg/dL (ref 8.7–10.2)
Chloride: 103 mmol/L (ref 96–106)
Creatinine, Ser: 0.82 mg/dL (ref 0.76–1.27)
Globulin, Total: 2.5 g/dL (ref 1.5–4.5)
Glucose: 72 mg/dL (ref 65–99)
Potassium: 4.4 mmol/L (ref 3.5–5.2)
Sodium: 141 mmol/L (ref 134–144)
Total Protein: 7.4 g/dL (ref 6.0–8.5)
eGFR: 112 mL/min/{1.73_m2} (ref >59)

## 2020-08-19 LAB — VITAMIN D 25 HYDROXY (VIT D DEFICIENCY, FRACTURES): Vit D, 25-Hydroxy: 40.8 ng/mL (ref 30.0–100.0)

## 2020-08-20 ENCOUNTER — Telehealth: Payer: Self-pay | Admitting: *Deleted

## 2020-08-20 NOTE — Telephone Encounter (Signed)
Called patient and informed him his labs are normal. Patient verbalized understanding, appreciation.

## 2020-08-24 ENCOUNTER — Telehealth: Payer: Self-pay | Admitting: Diagnostic Neuroimaging

## 2020-08-24 NOTE — Telephone Encounter (Signed)
Scheduled at [facility] 08/26/20 45 mins MRI Brain w/wo contrast Dr. Theresa Mulligan Berkley Harvey: [phone] exp. 09/17/20

## 2020-08-26 ENCOUNTER — Other Ambulatory Visit: Payer: Self-pay

## 2020-08-26 ENCOUNTER — Ambulatory Visit: Payer: BC Managed Care – PPO

## 2020-08-26 DIAGNOSIS — G35 Multiple sclerosis: Secondary | ICD-10-CM | POA: Diagnosis not present

## 2020-08-26 MED ORDER — GADOBENATE DIMEGLUMINE 529 MG/ML IV SOLN
12.0000 mL | Freq: Once | INTRAVENOUS | Status: AC | PRN
  Administered 2020-08-26: 12 mL via INTRAVENOUS

## 2020-09-03 ENCOUNTER — Telehealth: Payer: Self-pay | Admitting: *Deleted

## 2020-09-03 NOTE — Telephone Encounter (Signed)
Called patient and informed him his MRI brain is stable from previous. Patient verbalized understanding, appreciation.

## 2021-02-17 ENCOUNTER — Telehealth: Payer: Self-pay | Admitting: *Deleted

## 2021-02-17 NOTE — Telephone Encounter (Signed)
Received fax from [facility]: dimethyl fumarate PA. PA on MD desk for review, signature.

## 2021-02-18 NOTE — Telephone Encounter (Signed)
Dimethyl fumarate PA signed, faxed to [facility]. Received confirmation.

## 2021-03-15 ENCOUNTER — Encounter (HOSPITAL_COMMUNITY): Payer: Self-pay

## 2021-03-15 ENCOUNTER — Other Ambulatory Visit: Payer: Self-pay

## 2021-03-15 ENCOUNTER — Ambulatory Visit (HOSPITAL_COMMUNITY)
Admission: EM | Admit: 2021-03-15 | Discharge: 2021-03-15 | Disposition: A | Discharge status: Home / Self Care | Payer: BC Managed Care – PPO | Source: Ambulatory Visit | Attending: Internal Medicine | Admitting: Internal Medicine

## 2021-03-15 DIAGNOSIS — Z20822 Contact with and (suspected) exposure to covid-19: Secondary | ICD-10-CM | POA: Insufficient documentation

## 2021-03-15 DIAGNOSIS — Z87891 Personal history of nicotine dependence: Secondary | ICD-10-CM | POA: Diagnosis not present

## 2021-03-15 DIAGNOSIS — J069 Acute upper respiratory infection, unspecified: Secondary | ICD-10-CM

## 2021-03-15 DIAGNOSIS — J029 Acute pharyngitis, unspecified: Secondary | ICD-10-CM | POA: Diagnosis not present

## 2021-03-15 LAB — POCT RAPID STREP A, ED / UC: Streptococcus, Group A Screen (Direct): NEGATIVE

## 2021-03-15 MED ORDER — PREDNISONE 10 MG PO TABS: 20.0000 mg | ORAL_TABLET | Freq: Every day | ORAL | 0 refills | Status: AC

## 2021-03-15 NOTE — ED Triage Notes (Signed)
Pt presents with sinus headache, nasal congestion, and sore throat X 3 days.

## 2021-03-15 NOTE — Discharge Instructions (Signed)
Your rapid strep was negative.  Throat culture is pending.  COVID-19 viral swab is also pending.  You have been prescribed low-dose and short course of prednisone to help alleviate your symptoms.  Please follow-up if symptoms persist or worsen.

## 2021-03-15 NOTE — ED Provider Notes (Signed)
[facility]    CSN: [id number] Arrival date & time: 03/15/21  0909      History   Chief Complaint Chief Complaint  Patient presents with   URI   Headache    HPI Shane Rollins is a 46 y.o. male.   Patient presents with sinus headache, nasal congestion, sore throat that has been present for 3 days.  Denies any known sick contacts or fever.  Denies cough, chest pain, shortness of breath, nausea, vomiting, abdominal pain, diarrhea.  Patient has taken Alka-Seltzer cold and flu with minimal improvement in symptoms.   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 No pertinent surgical history.     Home Medications    Prior to Admission medications   Medication Sig Start Date End Date Taking? Authorizing Provider  predniSONE (DELTASONE) 10 MG tablet Take 2 tablets (20 mg total) by mouth daily for 5 days. 03/15/21 03/20/21 Yes Raquelle Pietro, Acie Fredrickson, FNP  Dimethyl Fumarate 240 MG CPDR Take 1 capsule (240 mg total) by mouth 2 (two) times daily. 04/23/20   Penumalli, Glenford Bayley, MD  ibuprofen (ADVIL,MOTRIN) 800 MG tablet Take 800 mg by mouth every 6 (six) hours as needed. for pain 02/14/14   [provider]  Multiple Vitamin (MULTIVITAMIN) tablet Take 1 tablet by mouth daily.    [provider]  Probiotic Product (PROBIOTIC-10 PO) Take by mouth.    [provider]    Family History Family History  Problem Relation Age of Onset   Healthy Mother    Congestive Heart Failure Other    Depression Other     Social History Social History   Tobacco Use   Smoking status: Former    Packs/day: 0.50    Years: 12.00    Pack years: 6.00    Types: Cigarettes    Quit date: 03/15/2007    Years since quitting: 14.0   Smokeless tobacco: Never   Substance Use Topics   Alcohol use: Yes    Comment: 2 drinks weekly   Drug use: No     Allergies   Codeine   Review of Systems Review of Systems Per HPI  Physical Exam Triage Vital Signs ED Triage Vitals [03/15/21 1016]  Enc Vitals Group     BP 138/80     Pulse Rate 78     Resp 17     Temp 99.7 F (37.6 C)     Temp Source Oral     SpO2 97 %     Weight      Height      Head Circumference      Peak Flow      Pain Score 8     Pain Loc      Pain Edu?      Excl. in GC?    No data found.  Updated Vital Signs BP 138/80 (BP Location: Left Arm)    Pulse 78    Temp 99.7 F (37.6 C) (Oral)    Resp 17    SpO2 97%   Visual Acuity Right Eye Distance:   Left Eye Distance:   Bilateral Distance:    Right Eye Near:   Left Eye Near:    Bilateral Near:     Physical Exam  Constitutional:      General: He is not in acute distress.    Appearance: Normal appearance. He is not toxic-appearing or diaphoretic.  HENT:     Head: Normocephalic and atraumatic.     Right Ear: Ear canal normal. A middle ear effusion is present. Tympanic membrane is not perforated, erythematous or bulging.     Left Ear: Ear canal normal. A middle ear effusion is present. Tympanic membrane is not perforated, erythematous or bulging.     Nose: Congestion present.     Mouth/Throat:     Mouth: Mucous membranes are moist.     Pharynx: Posterior oropharyngeal erythema present.  Eyes:     Extraocular Movements: Extraocular movements intact.     Conjunctiva/sclera: Conjunctivae normal.     Pupils: Pupils are equal, round, and reactive to light.  Cardiovascular:     Rate and Rhythm: Normal rate and regular rhythm.     Pulses: Normal pulses.     Heart sounds: Normal heart sounds.  Pulmonary:     Effort: Pulmonary effort is normal. No respiratory distress.     Breath sounds: No stridor. No wheezing, rhonchi or rales.  Abdominal:     General: Abdomen is flat. Bowel sounds are normal.     Palpations:  Abdomen is soft.  Musculoskeletal:        General: Normal range of motion.     Cervical back: Normal range of motion.  Skin:    General: Skin is warm and dry.  Neurological:     General: No focal deficit present.     Mental Status: He is alert and oriented to person, place, and time. Mental status is at baseline.  Psychiatric:        Mood and Affect: Mood normal.        Behavior: Behavior normal.     UC Treatments / Results  Labs (all labs ordered are listed, but only abnormal results are displayed) Labs Reviewed  CULTURE, GROUP A STREP ([facility])  SARS CORONAVIRUS 2 (TAT 6-24 HRS)  POCT RAPID STREP A, ED / UC    EKG   Radiology No results found.  Procedures Procedures (including critical care time)  Medications Ordered in UC Medications - No data to display  Initial Impression / Assessment and Plan / UC Course  I have reviewed the triage vital signs and the nursing notes.  Pertinent labs & imaging results that were available during my care of the patient were reviewed by me and considered in my medical decision making (see chart for details).     Patient presents with symptoms likely from a viral upper respiratory infection. Differential includes bacterial pneumonia, sinusitis, allergic rhinitis, COVID-19, flu. Do not suspect underlying cardiopulmonary process. Symptoms seem unlikely related to ACS, CHF or COPD exacerbations, pneumonia, pneumothorax. Patient is nontoxic appearing and not in need of emergent medical intervention.  Rapid strep was negative.  Throat culture and COVID-19 viral swab pending.  Recommended symptom control with over the counter medications. Will treat with low dose and short course of prednisone as patient reports that he has taken this previously and tolerated well. Prednisone will be helpful with bilateral middle ear effusion and decreasing inflammation in sinuses as patient has not seen any success with antihistamine and flonase.   Return if  symptoms fail to improve in 1-2 weeks or you develop shortness of breath, chest pain, severe headache. Patient states understanding and is agreeable.  Discharged with PCP followup.  Final Clinical Impressions(s) / UC Diagnoses  Final diagnoses:  Viral upper respiratory infection  Sore throat     Discharge Instructions      Your rapid strep was negative.  Throat culture is pending.  COVID-19 viral swab is also pending.  You have been prescribed low-dose and short course of prednisone to help alleviate your symptoms.  Please follow-up if symptoms persist or worsen.     ED Prescriptions     Medication Sig Dispense Auth. Provider   predniSONE (DELTASONE) 10 MG tablet Take 2 tablets (20 mg total) by mouth daily for 5 days. 10 tablet Gustavus Bryant, [place]      PDMP not reviewed this encounter.   Gustavus Bryant, [place] 03/15/21 1046

## 2021-03-16 LAB — SARS CORONAVIRUS 2 (TAT 6-24 HRS): SARS Coronavirus 2: NEGATIVE

## 2021-03-18 LAB — CULTURE, GROUP A STREP (THRC)

## 2021-04-14 ENCOUNTER — Encounter: Payer: Self-pay | Admitting: *Deleted

## 2021-04-14 ENCOUNTER — Other Ambulatory Visit: Payer: Self-pay | Admitting: Diagnostic Neuroimaging

## 2021-04-14 DIAGNOSIS — G35 Multiple sclerosis: Secondary | ICD-10-CM

## 2021-06-15 ENCOUNTER — Telehealth: Payer: Self-pay | Admitting: *Deleted

## 2021-06-15 NOTE — Telephone Encounter (Signed)
Financial trader for [employer] called stating that the Dimethyl Fumarate is out of stock and she would like to know if a new Rx can be sent with a different NDC. Please advice. ?

## 2021-06-15 NOTE — Telephone Encounter (Signed)
Received from phone staff: ?[facility] for [place] called stating that the Dimethyl Fumarate is out of stock and she would like to know if a new Rx can be sent with a different NDC. Please advice. ?

## 2021-06-15 NOTE — Telephone Encounter (Signed)
Called [facility], spoke with Candy who stated dimethyl fumarate manufacturer didn't give ETA for when med will be restocked. Last shipped to patient 05/07/21 for 30 days supply. I asked to speak with pharmacist to discuss getting med form different manufacturer,  was put on hold. Candy came back online, stated the pharmacist said they would put in Rx with new NDC then call patient to set up shipment.  ?

## 2021-12-24 ENCOUNTER — Other Ambulatory Visit: Payer: Self-pay | Admitting: Diagnostic Neuroimaging

## 2021-12-24 DIAGNOSIS — G35 Multiple sclerosis: Secondary | ICD-10-CM

## 2022-01-13 ENCOUNTER — Telehealth: Payer: Self-pay | Admitting: Diagnostic Neuroimaging

## 2022-01-13 DIAGNOSIS — G35 Multiple sclerosis: Secondary | ICD-10-CM

## 2022-01-13 MED ORDER — DIMETHYL FUMARATE 240 MG PO CPDR: 1.0000 | DELAYED_RELEASE_CAPSULE | Freq: Two times a day (BID) | ORAL | 1 refills | Status: DC

## 2022-01-13 NOTE — Telephone Encounter (Signed)
Pt is needing a refill request for his Dimethyl Fumarate 240 MG CPDR [facility] [address] Pt has scheduled an appt on 11/9.

## 2022-01-13 NOTE — Telephone Encounter (Signed)
Refill sent x 2 months.

## 2022-01-19 NOTE — Progress Notes (Unsigned)
[facility]  PATIENT: Shane Rollins DOB: Mar 16, 1975  REFERRING CLINICIAN:  HISTORY FROM: patient REASON FOR VISIT: follow up   HISTORICAL  CHIEF COMPLAINT:  No chief complaint on file.   HISTORY OF PRESENT ILLNESS:   UPDATE 01/20/2022 JM: Patient returns for MS follow-up after prior visit with Dr. Leta Baptist over 1 year ago.  Continues to do well.  Remains on Tecfidera.  Symptoms stable.  Severity remains mild.   UPDATE (08/18/20, VRP): Since last visit, doing well. Symptoms are stable. Severity is mild. No alleviating or aggravating factors. Tolerating generic tecfidera.    UPDATE (06/25/19, VRP): Since last visit, doing well. Symptoms are stable. Severity is mild. No alleviating or aggravating factors. Tolerating tecfidera. Has had covid vaccine and doing well.   UPDATE (12/04/17, VRP): Since last visit, doing well. Symptoms are stable. Severity is mild. No alleviating or aggravating factors. Tolerating tecfidera. Lost to follow up due to financial issues. No numbness or tingling. Fatigue fluctuates.  UPDATE 11/30/15: Since last visit, doing about the same. Some intermittent urinary frequency/urgency and incomplete bladder emptying sensation. No pain or dysuria.   UPDATE 07/30/15: Since last visit, doing well. No new major issues. Some intermittent numbness. Some intermittent urinary frequency issues.  UPDATE 07/15/14: Since last visit, doing well. No new events. Int facial numbness continues. Still with some left hand/wrist/finger joint pains.  UPDATE 04/14/14: Since last visit, has had some intermittent facial numbness, night sweats, joint pain. Also with increasing, nagging headaches. He notes that he skips lunch most days. He was drinking Etoh 1-2 drinks per night, but recently quit, to see if HA would improve, but thery haven't changed much.   UPDATE 08/29/13: Since last visit doing well. Tolerating tecfidera. 1 month ago had 1 week of night sweats, with sinus  pressure and 1 day of fever. Some intermittent headaches with frontal sinus pressure. No nausea, vomiting, photphobia or phonophobia.   UPDATE 05/24/13: Since last visit, vision continue to improve. Now able to drive without eye patch. Some occ double vision with extreme right gaze. Also with intermittent, brief numbness in face, burning pains in left shoulder/scapula. Overall tolerating tecfidera (started 04/24/13), with no sig side effects. Taking meds with foods. Occ "quesey" stomach, but not nausea/vomiting/diarrhea.  UPDATE 04/10/13: Since last visit, right eye has been slightly improving. No new symptoms. Here to review test results, diagnosis, and treatment options.   UPDATE 03/04/13: Since last visit, symptoms stable. MRI results reviewed. No new events.  PRIOR HPI (02/19/13): 46 year old right-handed male here for evaluation of double vision. 2 weeks ago patient sudden onset double vision when looking to the right side. 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 Symptoms have been stable since that time. When patient covers one eye he does not have double vision. Symptoms are worse when he looks to the right or when he is outdoors. No prodromal illness, infection, cough, cold, change in diet or exercise. No traumas. In September an episode of left arm and left leg tingling sensation lasting for several hours. Patient had intermittent headaches and dizziness.   REVIEW OF SYSTEMS: Full 14 system review of systems performed and negative except: freq urination fatigue.      ALLERGIES: Allergies  Allergen Reactions   Codeine Nausea And Vomiting    HOME MEDICATIONS: Outpatient Encounter Medications as of 01/20/2022  Medication Sig   Dimethyl Fumarate 240 MG CPDR Take 1 capsule (240 mg total) by mouth 2 (two) times  daily.   ibuprofen (ADVIL,MOTRIN) 800 MG tablet Take 800 mg by mouth every 6 (six) hours as needed. for pain   Multiple Vitamin (MULTIVITAMIN) tablet Take  1 tablet by mouth daily.   Probiotic Product (PROBIOTIC-10 PO) Take by mouth.   No facility-administered encounter medications on file as of 01/20/2022.    PAST MEDICAL HISTORY: Past Medical History:  Diagnosis Date   Multiple sclerosis ([place])    PAST SURGICAL HISTORY: No past surgical history on file.  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   Caffeine Use: 1 cup daily   Social Determinants of Health   Financial Resource Strain: Not on file  Food Insecurity: Not on file  Transportation Needs: Not on file  Physical Activity: Not on file  Stress: Not on file  Social Connections: Not on file  Intimate Partner Violence: Not on file     PHYSICAL EXAM  There were no vitals filed for this visit.  Not recorded    Wt Readings from Last 3 Encounters:  08/18/20 137 lb 3.2 oz (62.2 kg)  06/25/19 132 lb 6.4 oz (60.1 kg)  12/04/17 134 lb 9.6 oz (61.1 kg)   There is no height or weight on file to calculate BMI.  GENERAL EXAM: Patient is in no distress; well developed, nourished and groomed; neck is supple  CARDIOVASCULAR: Regular rate and rhythm, no murmurs, no  carotid bruits  NEUROLOGIC: MENTAL STATUS: awake, alert, language fluent, comprehension intact, naming intact, fund of knowledge appropriate CRANIAL NERVE: pupils equal and reactive to light, visual fields full to confrontation, extraocular muscles intact, no nystagmus, facial sensation and strength symmetric, hearing intact, palate elevates symmetrically, uvula midline, shoulder shrug symmetric, tongue midline. MOTOR: normal bulk and tone, full strength in the BUE, BLE SENSORY: normal and symmetric to light touch COORDINATION: finger-nose-finger, fine finger movements normal REFLEXES: deep tendon reflexes present and symmetric GAIT/STATION: narrow based gait    DIAGNOSTIC DATA (LABS, IMAGING, TESTING) - I reviewed patient records, labs, notes, testing and imaging myself where available.  Lab Results  Component Value Date   WBC 5.3 08/18/2020   HGB 13.3 08/18/2020   HCT 39.0 08/18/2020   MCV 94 08/18/2020   PLT 252 08/18/2020      Component Value Date/Time   NA 141 08/18/2020 1657   K 4.4 08/18/2020 1657   CL 103 08/18/2020 1657   CO2 24 08/18/2020 1657   GLUCOSE 72 08/18/2020 1657   GLUCOSE 137 (  H) 10/24/2010 2234   BUN 7 08/18/2020 1657   CREATININE 0.82 08/18/2020 1657   CALCIUM 9.3 08/18/2020 1657   PROT 7.4 08/18/2020 1657   ALBUMIN 4.9 08/18/2020 1657   AST 28 08/18/2020 1657   ALT 23 08/18/2020 1657   ALKPHOS 89 08/18/2020 1657   BILITOT 0.4 08/18/2020 1657   GFRNONAA 113 06/25/2019 0908   GFRAA 130 06/25/2019 0908   No results found for: "CHOL", "HDL", "LDLCALC", "LDLDIRECT", "TRIG", "CHOLHDL" Lab Results  Component Value Date   HGBA1C 5.6 02/19/2013   Lab Results  Component Value Date   VITAMINB12 357 07/30/2015   Lab Results  Component Value Date   TSH 1.320 02/19/2013   Vit D, 25-Hydroxy  Date Value Ref Range Status  08/18/2020 40.8 30.0 - 100.0 ng/mL Final    Comment:    Vitamin D deficiency has been defined by the Institute of Medicine and an  Endocrine Society practice guideline as a level of serum 25-OH vitamin D less than 20 ng/mL (1,2). The Endocrine Society went on to further define vitamin D insufficiency as a level between 21 and 29 ng/mL (2). 1. IOM (Institute of Medicine). 2010. Dietary reference    intakes for calcium and D. [place]: The    [employer]. 2. Holick MF, Binkley [place], Bischoff-Ferrari HA, et al.    Evaluation, treatment, and prevention of vitamin D    deficiency: an Endocrine Society clinical practice    guideline. JCEM. 2011 Jul; 96(7):1911-30.   06/25/2019 33.5 30.0 - 100.0 ng/mL Final    Comment:    Vitamin D deficiency has been defined by the Institute of Medicine and an Endocrine Society practice guideline as a level of serum 25-OH vitamin D less than 20 ng/mL (1,2). The Endocrine Society went on to further define vitamin D insufficiency as a level between 21 and 29 ng/mL (2). 1. IOM (Institute of Medicine). 2010. Dietary reference    intakes for calcium and D. [place]: The    [employer]. 2. Holick MF, Binkley [place], Bischoff-Ferrari HA, et al.    Evaluation, treatment, and prevention of vitamin D    deficiency: an Endocrine Society clinical practice    guideline. JCEM. 2011 Jul; 96(7):1911-30.   07/30/2015 27.0 (L) 30.0 - 100.0 ng/mL Final    Comment:    Vitamin D deficiency has been defined by the Institute of Medicine and an Endocrine Society practice guideline as a level of serum 25-OH vitamin D less than 20 ng/mL (1,2). The Endocrine Society went on to further define vitamin D insufficiency as a level between 21 and 29 ng/mL (2). 1. IOM (Institute of Medicine). 2010. Dietary reference    intakes for calcium and D. [place]: The    [employer]. 2. Holick MF, Binkley [place], Bischoff-Ferrari HA, et al.    Evaluation, treatment, and prevention of vitamin D    deficiency: an Endocrine Society clinical practice    guideline. JCEM. 2011 Jul;  96(7):1911-30.     03/25/13 MRI cervical  - Right anterior T2 hyperintense spinal cord lesion from C2 to C3-4 level, measuring 3.0cm in cranio-caudal extent, and 0.4cm in antero-posterior extent.  - Right ponto-medullary T2 hyperintense lesion.  - Findings suspicious for chronic demyelinating disease. Other considerations include autoimmune, inflammatory, post-infectious or ischemic etiologies.  - No acute findings.  03/25/13 MRI thoracic - normal  05/15/14 MRI brain -  1. There are multiple periventricular, subcortical, juxtacortical, midbrain, pontine chronic demyelinating plaques. No acute plaques.  2. Compared to MRI on 09/04/13, there is no significant change.  04/29/18 MRI brain (with and without) demonstrating: - Few supratentorial and infratentorial chronic demyelinating plaques. - No acute plaques. - No change from MRI on 05/15/14.     03/04/13 Labs (B12, NMO ab, RPR, Hep B, Hep C, HIV, ANCA, ANA, Tb) - all negative/normal  03/04/13 JCV antibody - POSITIVE 1.95 (H)   ASSESSMENT AND PLAN  46 y.o. year old male here with sudden onset double vision in Nov/Dec 2014. MRI brain and c-spine consistent with multiple sclerosis. MS lab mimics were negative. Now on Tecfidera since 04/24/13, and doing well.  Dx: multiple sclerosis  No diagnosis found.    PLAN:  MULTIPLE SCLEROSIS (stable, on tecfidera) - continue tecfidera - check labs  - continue multi-vitamin, vitamin D 1000 units daily  No orders of the defined types were placed in this encounter.  No follow-ups on file.    I spent *** minutes of face-to-face and non-face-to-face time with patient.  This included previsit chart review, lab review, study review, order entry, electronic health record documentation, patient education  Frann Rider, [facility]  [facility] [address] [place], [place] [address]  Phone [phone] Fax [phone] Note: This document was prepared with  digital dictation and possible smart phrase technology. Any transcriptional errors that result from this process are unintentional.

## 2022-01-20 ENCOUNTER — Ambulatory Visit: Payer: BC Managed Care – PPO | Admitting: Adult Health

## 2022-01-20 ENCOUNTER — Encounter: Payer: Self-pay | Admitting: Adult Health

## 2022-01-20 VITALS — BP 120/70 | HR 58 | Ht 67.0 in | Wt 132.2 lb

## 2022-01-20 DIAGNOSIS — G35 Multiple sclerosis: Secondary | ICD-10-CM | POA: Diagnosis not present

## 2022-01-20 DIAGNOSIS — E559 Vitamin D deficiency, unspecified: Secondary | ICD-10-CM | POA: Diagnosis not present

## 2022-01-20 MED ORDER — DIMETHYL FUMARATE 240 MG PO CPDR: 1.0000 | DELAYED_RELEASE_CAPSULE | Freq: Two times a day (BID) | ORAL | 11 refills | Status: DC

## 2022-01-20 NOTE — Patient Instructions (Signed)
We will check lab work today   Continue dimethyl fumarate - refills provided  We will plan on repeating MRI brain next year   Follow up in 1 year or call earlier if needed

## 2022-01-21 LAB — COMPREHENSIVE METABOLIC PANEL
ALT: 30 IU/L (ref 0–44)
AST: 34 IU/L (ref 0–40)
Albumin/Globulin Ratio: 2.1 (ref 1.2–2.2)
Albumin: 5.3 g/dL — ABNORMAL HIGH (ref 4.1–5.1)
Alkaline Phosphatase: 88 IU/L (ref 44–121)
BUN/Creatinine Ratio: 12 (ref 9–20)
BUN: 13 mg/dL (ref 6–24)
Bilirubin Total: 0.5 mg/dL (ref 0.0–1.2)
CO2: 24 mmol/L (ref 20–29)
Calcium: 10.3 mg/dL — ABNORMAL HIGH (ref 8.7–10.2)
Chloride: 102 mmol/L (ref 96–106)
Creatinine, Ser: 1.1 mg/dL (ref 0.76–1.27)
Globulin, Total: 2.5 g/dL (ref 1.5–4.5)
Glucose: 98 mg/dL (ref 70–99)
Potassium: 4.4 mmol/L (ref 3.5–5.2)
Sodium: 142 mmol/L (ref 134–144)
Total Protein: 7.8 g/dL (ref 6.0–8.5)
eGFR: 85 mL/min/{1.73_m2} (ref >59)

## 2022-01-21 LAB — CBC WITH DIFFERENTIAL/PLATELET
Basophils Absolute: 0.1 10*3/uL (ref 0.0–0.2)
Basos: 1 %
EOS (ABSOLUTE): 0 10*3/uL (ref 0.0–0.4)
Eos: 1 %
Hematocrit: 41.8 % (ref 37.5–51.0)
Hemoglobin: 14.2 g/dL (ref 13.0–17.7)
Immature Grans (Abs): 0 10*3/uL (ref 0.0–0.1)
Immature Granulocytes: 0 %
Lymphocytes Absolute: 1.7 10*3/uL (ref 0.7–3.1)
Lymphs: 30 %
MCH: 32.9 pg (ref 26.6–33.0)
MCHC: 34 g/dL (ref 31.5–35.7)
MCV: 97 fL (ref 79–97)
Monocytes Absolute: 0.5 10*3/uL (ref 0.1–0.9)
Monocytes: 8 %
Neutrophils Absolute: 3.5 10*3/uL (ref 1.4–7.0)
Neutrophils: 60 %
Platelets: 272 10*3/uL (ref 150–450)
RBC: 4.32 x10E6/uL (ref 4.14–5.80)
RDW: 11.8 % (ref 11.6–15.4)
WBC: 5.7 10*3/uL (ref 3.4–10.8)

## 2022-01-21 LAB — VITAMIN D 25 HYDROXY (VIT D DEFICIENCY, FRACTURES): Vit D, 25-Hydroxy: 62.8 ng/mL (ref 30.0–100.0)

## 2022-02-22 ENCOUNTER — Telehealth: Payer: Self-pay | Admitting: Neurology

## 2022-02-22 NOTE — Telephone Encounter (Signed)
PA completed and approved through [facility] [employer] Approved 02/21/2022-02/22/2023

## 2023-01-19 ENCOUNTER — Ambulatory Visit: Payer: BC Managed Care – PPO | Admitting: Adult Health

## 2023-01-19 ENCOUNTER — Encounter: Payer: Self-pay | Admitting: Adult Health

## 2023-01-19 ENCOUNTER — Telehealth: Payer: Self-pay | Admitting: Adult Health

## 2023-01-19 VITALS — BP 109/75 | HR 82 | Ht 67.0 in | Wt 131.8 lb

## 2023-01-19 DIAGNOSIS — E539 Vitamin B deficiency, unspecified: Secondary | ICD-10-CM | POA: Diagnosis not present

## 2023-01-19 DIAGNOSIS — Z79899 Other long term (current) drug therapy: Secondary | ICD-10-CM

## 2023-01-19 DIAGNOSIS — E559 Vitamin D deficiency, unspecified: Secondary | ICD-10-CM

## 2023-01-19 DIAGNOSIS — G35D Multiple sclerosis, unspecified: Secondary | ICD-10-CM

## 2023-01-19 DIAGNOSIS — G35 Multiple sclerosis: Secondary | ICD-10-CM | POA: Diagnosis not present

## 2023-01-19 NOTE — Progress Notes (Signed)
[facility]  PATIENT: Shane Rollins DOB: [birth year]  REFERRING CLINICIAN:  HISTORY FROM: patient REASON FOR VISIT: follow up   HISTORICAL  CHIEF COMPLAINT:  Chief Complaint  Patient presents with   Follow-up    Patient in room #3 and alone. Patient states he been well but would like to discuss if his MS can cause some blurred vision or is he getting old.    HISTORY OF PRESENT ILLNESS:    Update 01/19/2023 JM: Returns for yearly MS follow-up.  MS remains stable, continues on dimethyl fumarate 240mg  BID, denies side effects.  Does still have intermittent left sided numbness, occasional cold sensation on LLE distally and occasional right (and possibly at times left) eyelid weakness without pain or vision changes. All present and stable since initial diagnosis. Does note some decline in vision more so with difficulty reading small print, prior eye exam over 3 years ago, was rx'd glasses but never obtained.  Reports good gait and balance. Denies new weakness or numbness.  Does have some urinary frequency, on oxybutynin per urology.    UPDATE 01/20/2022 JM: Patient returns for MS follow-up after prior visit with Dr. Marjory Lies over 1 year ago.  Continues to do well.  Remains on dimethyl fumarate.  Symptoms stable.  Severity remains mild.  UPDATE (08/18/20, VRP): Since last visit, doing well. Symptoms are stable. Severity is mild. No alleviating or aggravating factors. Tolerating generic tecfidera.    UPDATE (06/25/19, VRP): Since last visit, doing well. Symptoms are stable. Severity is mild. No alleviating or aggravating factors. Tolerating tecfidera. Has had covid vaccine and doing well.   UPDATE (12/04/17, VRP): Since last visit, doing well. Symptoms are stable. Severity is mild. No alleviating or aggravating factors. Tolerating tecfidera. Lost to follow up due to financial issues. No numbness or tingling. Fatigue fluctuates.  UPDATE 11/30/15: Since last visit, doing about  the same. Some intermittent urinary frequency/urgency and incomplete bladder emptying sensation. No pain or dysuria.   UPDATE 07/30/15: Since last visit, doing well. No new major issues. Some intermittent numbness. Some intermittent urinary frequency issues.  UPDATE 07/15/14: Since last visit, doing well. No new events. Int facial numbness continues. Still with some left hand/wrist/finger joint pains.  UPDATE 04/14/14: Since last visit, has had some intermittent facial numbness, night sweats, joint pain. Also with increasing, nagging headaches. He notes that he skips lunch most days. He was drinking Etoh 1-2 drinks per night, but recently quit, to see if HA would improve, but thery haven't changed much.   UPDATE 08/29/13: Since last visit doing well. Tolerating tecfidera. 1 month ago had 1 week of night sweats, with sinus pressure and 1 day of fever. Some intermittent headaches with frontal sinus pressure. No nausea, vomiting, photphobia or phonophobia.   UPDATE 05/24/13: Since last visit, vision continue to improve. Now able to drive without eye patch. Some occ double vision with extreme right gaze. Also with intermittent, brief numbness in face, burning pains in left shoulder/scapula. Overall tolerating tecfidera (started 04/24/13), with no sig side effects. Taking meds with foods. Occ "quesey" stomach, but not nausea/vomiting/diarrhea.  UPDATE 04/10/13: Since last visit, right eye has been slightly improving. No new symptoms. Here to review test results, diagnosis, and treatment options.   UPDATE 03/04/13: Since last visit, symptoms stable. MRI results reviewed. No new events.  PRIOR HPI (02/19/13): 47 year old right-handed male here for evaluation of double vision. 2 weeks ago patient sudden onset double vision when looking to the right side. 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 Symptoms have been stable since that time. When patient covers one eye he does not have double  vision. Symptoms are worse when he looks to the right or when he is outdoors. No prodromal illness, infection, cough, cold, change in diet or exercise. No traumas. In September an episode of left arm and left leg tingling sensation lasting for several hours. Patient had intermittent headaches and dizziness.    REVIEW OF SYSTEMS: Full 14 system review of systems performed and negative except: Listed in HPI     ALLERGIES: Allergies  Allergen Reactions   Codeine Nausea And Vomiting    HOME MEDICATIONS: Outpatient Encounter Medications as of 01/19/2023  Medication Sig   Dimethyl Fumarate 240 MG CPDR Take 1 capsule (240 mg total) by mouth 2 (two) times daily.   ibuprofen (ADVIL,MOTRIN) 800 MG tablet Take 800 mg by mouth every 6 (six) hours as needed. for pain   Multiple Vitamin (MULTIVITAMIN) tablet Take 1 tablet by mouth daily.   oxybutynin (DITROPAN-XL) 10 MG 24 hr tablet Take 10 mg by mouth daily.   Probiotic Product (PROBIOTIC-10 PO) Take by mouth.   Vibegron (GEMTESA) 75 MG TABS Take 1 tablet by mouth daily.   No facility-administered encounter medications on file as of 01/19/2023.    PAST MEDICAL HISTORY: Past Medical History:  Diagnosis Date   Multiple sclerosis (HCC)    PAST SURGICAL HISTORY: History reviewed. No pertinent surgical history.  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   Caffeine Use: 1 cup daily   Social Determinants of Health   Financial Resource Strain: Not on file  Food Insecurity: Not on file  Transportation Needs: Not on file  Physical Activity: Not on file  Stress: Not on file  Social Connections: Unknown (08/11/2021)   [facility] from [facility], [facility]   Social Network    Social Network: Not on file  Intimate Partner Violence: Unknown (08/11/2021)   Received from [facility], [facility]   HITS    Physically Hurt: Not on file    Insult or Talk Down To: Not on file    Threaten Physical Harm: Not on file    Scream or Curse: Not on file     PHYSICAL EXAM  Vitals:   01/19/23 1344  BP: 109/75  Pulse: 82  Weight: 131 lb 12.8 oz (59.8  kg)  Height: 5\' 7"  (1.702 m)     Not recorded    Wt Readings from Last 3 Encounters:  01/19/23 131 lb 12.8 oz (59.8 kg)  01/20/22 132 lb 4 oz (60 kg)  08/18/20 137 lb 3.2 oz (62.2 kg)   Body mass index is 20.64 kg/m.  GENERAL EXAM: Patient is in no distress; well developed, nourished and groomed; neck is supple  CARDIOVASCULAR: Regular rate and rhythm, no murmurs, no carotid bruits  NEUROLOGIC: MENTAL STATUS: awake, alert, language fluent, comprehension intact, naming intact, fund of knowledge appropriate CRANIAL NERVE: pupils equal and reactive to light, visual fields full to confrontation, extraocular muscles intact, no nystagmus, facial sensation and strength symmetric, hearing intact, palate elevates symmetrically, uvula midline, shoulder shrug symmetric, tongue midline. MOTOR: normal bulk and tone, full strength in the BUE, BLE SENSORY: normal and symmetric to light touch COORDINATION:  finger-nose-finger, fine finger movements normal REFLEXES: deep tendon reflexes present and symmetric GAIT/STATION: narrow based gait    DIAGNOSTIC DATA (LABS, IMAGING, TESTING) - I reviewed patient records, labs, notes, testing and imaging myself where available.  Lab Results  Component Value Date   WBC 5.7 01/20/2022   HGB 14.2 01/20/2022   HCT 41.8 01/20/2022   MCV 97 01/20/2022   PLT 272 01/20/2022      Component Value Date/Time   NA 142 01/20/2022 1326   K 4.4 01/20/2022 1326   CL 102 01/20/2022 1326   CO2 24 01/20/2022 1326   GLUCOSE 98 01/20/2022 1326   GLUCOSE 137 (H) 10/24/2010 2234   BUN 13 01/20/2022 1326   CREATININE 1.10 01/20/2022 1326   CALCIUM 10.3 (H) 01/20/2022 1326   PROT 7.8 01/20/2022 1326   ALBUMIN 5.3 (H) 01/20/2022 1326   AST 34 01/20/2022 1326   ALT 30 01/20/2022 1326   ALKPHOS 88 01/20/2022 1326   BILITOT 0.5 01/20/2022 1326   GFRNONAA 113 06/25/2019 0908   GFRAA 130 06/25/2019 0908   No results found for: "CHOL", "HDL", "LDLCALC", "LDLDIRECT", "TRIG", "CHOLHDL" Lab Results  Component Value Date   HGBA1C 5.6 02/19/2013   Lab Results  Component Value Date   VITAMINB12 357 07/30/2015   Lab Results  Component Value Date   TSH 1.320 02/19/2013   Vit D, 25-Hydroxy  Date Value Ref Range Status  01/20/2022 62.8 30.0 - 100.0 ng/mL Final    Comment:    Vitamin D deficiency has been defined by the Institute of Medicine and an Endocrine Society practice guideline as a level of serum 25-OH vitamin D less than 20 ng/mL (1,2). The Endocrine Society went on to further define vitamin D insufficiency as a level between 21 and 29 ng/mL (2). 1. IOM (Institute of Medicine). 2010. Dietary reference    intakes for calcium and D. [place]: The    [employer]. 2. Holick MF, Binkley [place], Bischoff-Ferrari HA, et al.    Evaluation, treatment, and prevention of vitamin D    deficiency: an Endocrine Society clinical practice    guideline.  JCEM. 2011 Jul; 96(7):1911-30.   08/18/2020 40.8 30.0 - 100.0 ng/mL Final    Comment:    Vitamin D deficiency has been defined by the Institute of Medicine and an Endocrine Society practice guideline as a level of serum 25-OH vitamin D less than 20 ng/mL (1,2). The Endocrine Society went on to further define vitamin D insufficiency as a level between 21 and 29 ng/mL (2). 1. IOM (Institute of Medicine). 2010. Dietary reference    intakes for  calcium and D. [place]: The    [employer]. 2. Holick MF, Binkley [place], Bischoff-Ferrari HA, et al.    Evaluation, treatment, and prevention of vitamin D    deficiency: an Endocrine Society clinical practice    guideline. JCEM. 2011 Jul; 96(7):1911-30.   06/25/2019 33.5 30.0 - 100.0 ng/mL Final    Comment:    Vitamin D deficiency has been defined by the Institute of Medicine and an Endocrine Society practice guideline as a level of serum 25-OH vitamin D less than 20 ng/mL (1,2). The Endocrine Society went on to further define vitamin D insufficiency as a level between 21 and 29 ng/mL (2). 1. IOM (Institute of Medicine). 2010. Dietary reference    intakes for calcium and D. [place]: The    [employer]. 2. Holick MF, Binkley [place], Bischoff-Ferrari HA, et al.    Evaluation, treatment, and prevention of vitamin D    deficiency: an Endocrine Society clinical practice    guideline. JCEM. 2011 Jul; 96(7):1911-30.     03/25/13 MRI cervical  - Right anterior T2 hyperintense spinal cord lesion from C2 to C3-4 level, measuring 3.0cm in cranio-caudal extent, and 0.4cm in antero-posterior extent.  - Right ponto-medullary T2 hyperintense lesion.  - Findings suspicious for chronic demyelinating disease. Other considerations include autoimmune, inflammatory, post-infectious or ischemic etiologies.  - No acute findings.  03/25/13 MRI thoracic - normal  05/15/14 MRI brain -  1. There are multiple periventricular, subcortical,  juxtacortical, midbrain, pontine chronic demyelinating plaques. No acute plaques.   2. Compared to MRI on 09/04/13, there is no significant change.  04/29/18 MRI brain (with and without) demonstrating: - Few supratentorial and infratentorial chronic demyelinating plaques. - No acute plaques. - No change from MRI on 05/15/14.   08/26/2020 MRI brain  IMPRESSION: Abnormal MRI scan of the brain with and without contrast showing scattered periventricular, corpus callosum and brainstem white matter hyperintensities on T2/FLAIR compatible with regions of chronic medical sclerosis.  No enhancing lesions are noted.  No significant atrophy or T1 Bio-Kult seen.  Overall no significant change compared with previous MRI from 04/29/2018.   03/04/13 Labs (B12, NMO ab, RPR, Hep B, Hep C, HIV, ANCA, ANA, Tb) - all negative/normal  03/04/13 JCV antibody - POSITIVE 1.95 (H)     ASSESSMENT AND PLAN  47 y.o. year old male here with sudden onset double vision in Nov/Dec 2014. MRI brain and c-spine consistent with multiple sclerosis. MS lab mimics were negative. Now on Tecfidera since 04/24/13, and doing well.  Dx: multiple sclerosis  MS (multiple sclerosis) (HCC) - Plan: CBC with Differential/Platelets, CMP, MR BRAIN W WO CONTRAST  Vitamin D deficiency - Plan: VITAMIN D 25 Hydroxy (Vit-D Deficiency, Fractures)  High risk medication use - Plan: CBC with Differential/Platelets, CMP  Vitamin B deficiency - Plan: Vitamin B12    PLAN:  MULTIPLE SCLEROSIS (stable, on tecfidera) - continue dimethyl fumarate 240mg  BID -obtain CBC/D, and CMP today -repeat MR brain w/o contrast as prior imaging in 08/2020 -suspect decline in vision more due to needing prescription glasses previously prescribed over 3 years ago but obtaining MRI brain will also ensure not being caused from MS. Encouraged patient to schedule f/u with ophthalmology -hx of vit D and B12 deficiency, repeat lab work today   Orders Placed This Encounter   Procedures   MR BRAIN W WO CONTRAST   CBC with Differential/Platelets   CMP   VITAMIN D 25 Hydroxy (Vit-D Deficiency, Fractures)   Vitamin B12  Return in about 1 year (around 01/19/2024).     I spent 25 minutes of face-to-face and non-face-to-face time with patient.  This included previsit chart review, lab review, study review, order entry, electronic health record documentation, patient education and discussion regarding above diagnoses and treatment plan and answered all the questions to patient satisfaction  Ihor Austin, [facility]  [facility] [address] [place], [place] [address]  Phone [phone] Fax [phone] Note: This document was prepared with digital dictation and possible smart phrase technology. Any transcriptional errors that result from this process are unintentional.

## 2023-01-19 NOTE — Telephone Encounter (Signed)
Pt scheduled for 45 mins MR brain w/wo contrast at [facility] for 01/31/23 at 3pm.  [employer] auth# [id number] (01/19/23-02/17/23)

## 2023-01-19 NOTE — Patient Instructions (Signed)
Your Plan:  Continue Tecfidera at this time  You will be called to schedule a repeat MRI Brain to assess for any MS progression  We will check lab work today - I will notify you via MyChart regarding these results    Follow up in 1 year or call earlier if needed     Thank you for coming to see [place] at [facility]. I hope we have been able to provide you high quality care today.  You may receive a patient satisfaction survey over the next few weeks. We would appreciate your feedback and comments so that we may continue to improve ourselves and the health of our patients.

## 2023-01-20 LAB — COMPREHENSIVE METABOLIC PANEL
ALT: 28 [IU]/L (ref 0–44)
AST: 34 [IU]/L (ref 0–40)
Albumin: 5.1 g/dL (ref 4.1–5.1)
Alkaline Phosphatase: 96 [IU]/L (ref 44–121)
BUN/Creatinine Ratio: 12 (ref 9–20)
BUN: 13 mg/dL (ref 6–24)
Bilirubin Total: 0.6 mg/dL (ref 0.0–1.2)
CO2: 23 mmol/L (ref 20–29)
Calcium: 10 mg/dL (ref 8.7–10.2)
Chloride: 102 mmol/L (ref 96–106)
Creatinine, Ser: 1.05 mg/dL (ref 0.76–1.27)
Globulin, Total: 2.5 g/dL (ref 1.5–4.5)
Glucose: 96 mg/dL (ref 70–99)
Potassium: 4.8 mmol/L (ref 3.5–5.2)
Sodium: 141 mmol/L (ref 134–144)
Total Protein: 7.6 g/dL (ref 6.0–8.5)
eGFR: 89 mL/min/{1.73_m2} (ref >59)

## 2023-01-20 LAB — CBC WITH DIFFERENTIAL/PLATELET
Basophils Absolute: 0 10*3/uL (ref 0.0–0.2)
Basos: 1 %
EOS (ABSOLUTE): 0.1 10*3/uL (ref 0.0–0.4)
Eos: 2 %
Hematocrit: 42.7 % (ref 37.5–51.0)
Hemoglobin: 13.8 g/dL (ref 13.0–17.7)
Immature Grans (Abs): 0 10*3/uL (ref 0.0–0.1)
Immature Granulocytes: 0 %
Lymphocytes Absolute: 1.3 10*3/uL (ref 0.7–3.1)
Lymphs: 19 %
MCH: 31.7 pg (ref 26.6–33.0)
MCHC: 32.3 g/dL (ref 31.5–35.7)
MCV: 98 fL — ABNORMAL HIGH (ref 79–97)
Monocytes Absolute: 0.5 10*3/uL (ref 0.1–0.9)
Monocytes: 7 %
Neutrophils Absolute: 5 10*3/uL (ref 1.4–7.0)
Neutrophils: 71 %
Platelets: 292 10*3/uL (ref 150–450)
RBC: 4.35 x10E6/uL (ref 4.14–5.80)
RDW: 11.6 % (ref 11.6–15.4)
WBC: 6.9 10*3/uL (ref 3.4–10.8)

## 2023-01-20 LAB — VITAMIN B12: Vitamin B-12: 356 pg/mL (ref 232–1245)

## 2023-01-20 LAB — VITAMIN D 25 HYDROXY (VIT D DEFICIENCY, FRACTURES): Vit D, 25-Hydroxy: 15.2 ng/mL — ABNORMAL LOW (ref 30.0–100.0)

## 2023-01-23 ENCOUNTER — Other Ambulatory Visit: Payer: Self-pay | Admitting: Adult Health

## 2023-01-23 DIAGNOSIS — G35 Multiple sclerosis: Secondary | ICD-10-CM

## 2023-01-24 ENCOUNTER — Other Ambulatory Visit: Payer: Self-pay

## 2023-01-31 ENCOUNTER — Ambulatory Visit (INDEPENDENT_AMBULATORY_CARE_PROVIDER_SITE_OTHER): Payer: BC Managed Care – PPO

## 2023-01-31 DIAGNOSIS — G35 Multiple sclerosis: Secondary | ICD-10-CM | POA: Diagnosis not present

## 2023-01-31 MED ORDER — GADOBENATE DIMEGLUMINE 529 MG/ML IV SOLN
10.0000 mL | Freq: Once | INTRAVENOUS | Status: AC | PRN
  Administered 2023-01-31: 10 mL via INTRAVENOUS

## 2023-03-21 ENCOUNTER — Telehealth: Payer: Self-pay | Admitting: Adult Health

## 2023-03-21 NOTE — Telephone Encounter (Signed)
 The Paper fax PA for the patient was already forwarded to the PA team and they will begin to work on this. I will also route this call to them to follow up on pt

## 2023-03-21 NOTE — Telephone Encounter (Signed)
 Pt checking status of PA for refill. Requesting call back

## 2023-03-21 NOTE — Telephone Encounter (Signed)
 [facility] Knee) getting a rejection that a PA for Dimethyl Fumarate 240 MG CPDR  is needed.

## 2023-03-22 ENCOUNTER — Telehealth: Payer: Self-pay

## 2023-03-22 ENCOUNTER — Other Ambulatory Visit (HOSPITAL_COMMUNITY): Payer: Self-pay

## 2023-03-22 NOTE — Telephone Encounter (Signed)
 PA request has been Submitted. New Encounter created for follow up. For additional info see Pharmacy Prior Auth telephone encounter from 03/22/2023.

## 2023-03-22 NOTE — Telephone Encounter (Addendum)
 Pharmacy Patient Advocate Encounter   Received notification from Physician's Office that prior authorization for Dimethyl Fumarate  240MG  dr capsules is required/requested.   Insurance verification completed.   The patient is insured through [facility] .   Per test claim: PA required; PA started via CoverMyMeds. KEY [id number] . Waiting for clinical questions to populate.  PA expired-had to resubmit.

## 2023-03-24 NOTE — Telephone Encounter (Signed)
 Pharmacy Patient Advocate Encounter   Received notification from Physician's Office that prior authorization for Dimethyl Fumarate  is required/requested.   Insurance verification completed.   The patient is insured through [facility] .   Per test claim: PA required; PA started via CoverMyMeds. KEY [id number] . Waiting for clinical questions to populate.

## 2023-03-27 NOTE — Telephone Encounter (Signed)
 Pt has called back to provide the telephone # [phone] given to him by his insurance company, he states this is who needs to be called to provide needed info.  Pt is asking to be called about a request for samples. Pt has been without medication for over a week.

## 2023-03-27 NOTE — Telephone Encounter (Signed)
 PA approved for the pt. 03/27/23-03/26/2024 PA# [facility] Plan [phone] [id number]

## 2023-03-27 NOTE — Telephone Encounter (Signed)
 Pt called to check on status of PA for Dimethyl Fumarate 240 MG CPDR

## 2023-03-27 NOTE — Telephone Encounter (Signed)
 I called the number pt provided and spoke with clinical representative to check on status of the PA. And this PA has been cancelled stating that the PA in need of clinical questions to be answered.. I will attempt PA once more on CMM and try processing as urgent request.  EJ:[email or web address]  XZB:[id number] Awaiting questions. We don't have samples for this medication unfortunately.

## 2023-03-27 NOTE — Telephone Encounter (Signed)
Called pt and informed him of this. Pt verbalized understanding. 

## 2023-03-27 NOTE — Telephone Encounter (Signed)
 Pt called back again and left this message- Pt has called back to provide the telephone # [phone] given to him by his insurance company, he states this is who needs to be called to provide needed info.   I called the number pt provided and spoke with clinical representative to check on status of the PA. And this PA has been cancelled stating that the PA questions were not answered. I will attempt PA once more on CMM and try processing as urgent request.  EJ:[email or web address]  XZB:[id number]

## 2023-03-27 NOTE — Telephone Encounter (Signed)
 PA approved through [facility] 03/27/2023-03/26/2024

## 2023-03-27 NOTE — Telephone Encounter (Signed)
 I have routed pt's question to the pharmacy team and will await to hear back from them.

## 2023-03-27 NOTE — Telephone Encounter (Signed)
Questions answered. Waiting on determination

## 2023-04-21 ENCOUNTER — Ambulatory Visit (HOSPITAL_COMMUNITY): Payer: Self-pay

## 2023-08-26 ENCOUNTER — Encounter (HOSPITAL_COMMUNITY): Payer: Self-pay

## 2023-08-26 ENCOUNTER — Ambulatory Visit (HOSPITAL_COMMUNITY)
Admission: EM | Admit: 2023-08-26 | Discharge: 2023-08-26 | Disposition: A | Discharge status: Home / Self Care | Attending: Internal Medicine | Admitting: Internal Medicine

## 2023-08-26 DIAGNOSIS — Z113 Encounter for screening for infections with a predominantly sexual mode of transmission: Secondary | ICD-10-CM | POA: Insufficient documentation

## 2023-08-26 DIAGNOSIS — R369 Urethral discharge, unspecified: Secondary | ICD-10-CM | POA: Insufficient documentation

## 2023-08-26 DIAGNOSIS — R300 Dysuria: Secondary | ICD-10-CM | POA: Diagnosis present

## 2023-08-26 LAB — POCT URINALYSIS DIP (MANUAL ENTRY)
Bilirubin, UA: NEGATIVE
Blood, UA: NEGATIVE
Glucose, UA: NEGATIVE mg/dL
Nitrite, UA: NEGATIVE
Protein Ur, POC: NEGATIVE mg/dL
Spec Grav, UA: 1.02 (ref 1.010–1.025)
Urobilinogen, UA: 1 U/dL
pH, UA: 6 (ref 5.0–8.0)

## 2023-08-26 NOTE — ED Provider Notes (Signed)
 [facility]    CSN: [id number] Arrival date & time: 08/26/23  1550      History   Chief Complaint Chief Complaint  Patient presents with   Penile Discharge    HPI Shane Rollins is a 48 y.o. male.   48 year old male who presents urgent care with complaints of penile discharge and severe pain with urination.  He reports this started 2 days ago.  He reports the discharge is whitish color.  It is very painful when he urinates.  It is also painful when soap hits the area.  He denies any lower abdominal pain, fever, chills, blood in the urine, swelling.  He does report he had unprotected sex recently.  He does not have any specific concerns for STI but would like to have testing done today.   Penile Discharge Pertinent negatives include no chest pain, no abdominal pain and no shortness of breath.    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 No pertinent surgical history.     Home Medications    Prior to Admission medications   Medication Sig Start Date End Date Taking? Authorizing Provider  Dimethyl Fumarate  240 MG CPDR TAKE 1 CAPSULE BY MOUTH 2 TIMES A DAY 01/24/23  Yes McCue, Camilo Cella, NP  ibuprofen (ADVIL,MOTRIN) 800 MG tablet Take 800 mg by mouth every 6 (six) hours as needed. for pain 02/14/14  Yes [provider]  Multiple Vitamin (MULTIVITAMIN) tablet Take 1 tablet by mouth daily.   Yes [provider]  oxybutynin (DITROPAN-XL) 10 MG 24 hr tablet Take 10 mg by mouth daily.   Yes [provider]  Probiotic Product (PROBIOTIC-10 PO) Take by mouth.   Yes [provider]  Vibegron (GEMTESA) 75 MG TABS Take 1 tablet by mouth daily. 06/09/22   [provider]    Family History Family History  Problem Relation Age of Onset   Healthy Mother     Congestive Heart Failure Other    Depression Other     Social History Social History   Tobacco Use   Smoking status: Former    Current packs/day: 0.00    Average packs/day: 0.5 packs/day for 12.0 years (6.0 ttl pk-yrs)    Types: Cigarettes    Start date: 03/15/1995    Quit date: 03/15/2007    Years since quitting: 16.4   Smokeless tobacco: Never  Substance Use Topics   Alcohol use: Yes    Comment: 2 drinks weekly   Drug use: No     Allergies   Codeine   Review of Systems Review of Systems  Constitutional:  Negative for chills and fever.  HENT:  Negative for ear pain and sore throat.   Eyes:  Negative for pain and visual disturbance.  Respiratory:  Negative for cough and shortness of breath.   Cardiovascular:  Negative for chest pain and palpitations.  Gastrointestinal:  Negative for abdominal pain and vomiting.  Genitourinary:  Positive for dysuria and penile discharge. Negative for hematuria.  Musculoskeletal:  Negative for arthralgias and back pain.  Skin:  Negative for color change and rash.  Neurological:  Negative for seizures and syncope.  All other systems reviewed and are negative.    Physical Exam Triage Vital Signs ED Triage Vitals [08/26/23 1614]  Encounter Vitals Group  BP (!) 153/77     Girls Systolic BP Percentile      Girls Diastolic BP Percentile      Boys Systolic BP Percentile      Boys Diastolic BP Percentile      Pulse Rate 98     Resp 18     Temp 98.3 F (36.8 C)     Temp Source Oral     SpO2 98 %     Weight      Height      Head Circumference      Peak Flow      Pain Score      Pain Loc      Pain Education      Exclude from Growth Chart    No data found.  Updated Vital Signs BP (!) 153/77 (BP Location: Left Arm)   Pulse 98   Temp 98.3 F (36.8 C) (Oral)   Resp 18   SpO2 98%   Visual Acuity Right Eye Distance:   Left Eye Distance:   Bilateral Distance:    Right Eye Near:   Left Eye Near:    Bilateral Near:      Physical Exam Vitals and nursing note reviewed.  Constitutional:      General: He is not in acute distress.    Appearance: He is well-developed.  HENT:     Head: Normocephalic and atraumatic.   Eyes:     Conjunctiva/sclera: Conjunctivae normal.    Cardiovascular:     Rate and Rhythm: Normal rate and regular rhythm.     Heart sounds: No murmur heard. Pulmonary:     Effort: Pulmonary effort is normal. No respiratory distress.     Breath sounds: Normal breath sounds.  Abdominal:     Palpations: Abdomen is soft.     Tenderness: There is no abdominal tenderness.   Musculoskeletal:        General: No swelling.     Cervical back: Neck supple.   Skin:    General: Skin is warm and dry.     Capillary Refill: Capillary refill takes less than 2 seconds.   Neurological:     Mental Status: He is alert.   Psychiatric:        Mood and Affect: Mood normal.      UC Treatments / Results  Labs (all labs ordered are listed, but only abnormal results are displayed) Labs Reviewed  POCT URINALYSIS DIP (MANUAL ENTRY) - Abnormal; Notable for the following components:      Result Value   Ketones, POC UA moderate (40) (*)    Leukocytes, UA Small (1+) (*)    All other components within normal limits  CYTOLOGY, (ORAL, ANAL, URETHRAL) ANCILLARY ONLY    EKG   Radiology No results found.  Procedures Procedures (including critical care time)  Medications Ordered in UC Medications - No data to display  Initial Impression / Assessment and Plan / UC Course  I have reviewed the triage vital signs and the nursing notes.  Pertinent labs & imaging results that were available during my care of the patient were reviewed by me and considered in my medical decision making (see chart for details).     Dysuria  Penile discharge  Screening examination for STI   Urinalysis done today.  The findings are less consistent with an infectious process however we will send this off for a urine  culture and if bacteria grows then we will contact you to start antibiotics.  Screening swab done today and results will be available in 24-48 hours. We will contact you if we need to arrange additional treatment based on your testing. Negative results will be on your MyChart account. Abstain from sex until you receive your final results.  Use a condom for sexual encounters. If you have any worsening or changing symptoms including abnormal discharge, pelvic pain, abdominal pain, fever, nausea, or vomiting, then you should be reevaluated.    Final Clinical Impressions(s) / UC Diagnoses   Final diagnoses:  Dysuria  Penile discharge  Screening examination for STI     Discharge Instructions      Urinalysis done today.  The findings are less consistent with an infectious process however we will send this off for a urine culture and if bacteria grows then we will contact you to start antibiotics.  Screening swab done today and results will be available in 24-48 hours. We will contact you if we need to arrange additional treatment based on your testing. Negative results will be on your MyChart account. Abstain from sex until you receive your final results.  Use a condom for sexual encounters. If you have any worsening or changing symptoms including abnormal discharge, pelvic pain, abdominal pain, fever, nausea, or vomiting, then you should be reevaluated.       ED Prescriptions   None    PDMP not reviewed this encounter.   Kreg Pesa, [place] 08/26/23 1712

## 2023-08-26 NOTE — ED Triage Notes (Signed)
 Patient presents to office for white penile discharge x 2 days. Patient would like STI-testing.

## 2023-08-26 NOTE — Discharge Instructions (Addendum)
 Urinalysis done today.  The findings are less consistent with an infectious process however we will send this off for a urine culture and if bacteria grows then we will contact you to start antibiotics.  Screening swab done today and results will be available in 24-48 hours. We will contact you if we need to arrange additional treatment based on your testing. Negative results will be on your MyChart account. Abstain from sex until you receive your final results.  Use a condom for sexual encounters. If you have any worsening or changing symptoms including abnormal discharge, pelvic pain, abdominal pain, fever, nausea, or vomiting, then you should be reevaluated.

## 2023-08-27 LAB — URINE CULTURE: Culture: NO GROWTH

## 2023-08-28 ENCOUNTER — Ambulatory Visit (HOSPITAL_COMMUNITY): Payer: Self-pay

## 2023-08-28 LAB — CYTOLOGY, (ORAL, ANAL, URETHRAL) ANCILLARY ONLY
Chlamydia: NEGATIVE
Comment: NEGATIVE
Comment: NEGATIVE
Comment: NORMAL
Neisseria Gonorrhea: POSITIVE — AB
Trichomonas: NEGATIVE

## 2023-08-29 ENCOUNTER — Ambulatory Visit (HOSPITAL_COMMUNITY)
Admission: EM | Admit: 2023-08-29 | Discharge: 2023-08-29 | Disposition: A | Discharge status: Home / Self Care | Attending: Family Medicine | Admitting: Family Medicine

## 2023-08-29 ENCOUNTER — Encounter (HOSPITAL_COMMUNITY): Payer: Self-pay

## 2023-08-29 DIAGNOSIS — Z202 Contact with and (suspected) exposure to infections with a predominantly sexual mode of transmission: Secondary | ICD-10-CM

## 2023-08-29 MED ORDER — LIDOCAINE HCL (PF) 1 % IJ SOLN
INTRAMUSCULAR | Status: AC
Start: 2023-08-29 — End: 2023-08-29
  Filled 2023-08-29: qty 2

## 2023-08-29 MED ORDER — CEFTRIAXONE SODIUM 500 MG IJ SOLR
500.0000 mg | INTRAMUSCULAR | Status: DC
  Administered 2023-08-29: 500 mg via INTRAMUSCULAR

## 2023-08-29 MED ORDER — CEFTRIAXONE SODIUM 500 MG IJ SOLR
INTRAMUSCULAR | Status: AC
  Filled 2023-08-29: qty 500

## 2023-08-29 NOTE — ED Triage Notes (Signed)
Pt here for treatment for STD 

## 2024-01-18 ENCOUNTER — Ambulatory Visit: Payer: BC Managed Care – PPO | Admitting: Adult Health

## 2024-01-29 ENCOUNTER — Other Ambulatory Visit: Payer: Self-pay | Admitting: Adult Health

## 2024-01-29 DIAGNOSIS — G35D Multiple sclerosis, unspecified: Secondary | ICD-10-CM

## 2024-02-23 ENCOUNTER — Other Ambulatory Visit (HOSPITAL_COMMUNITY): Payer: Self-pay

## 2024-02-28 ENCOUNTER — Other Ambulatory Visit (HOSPITAL_COMMUNITY): Payer: Self-pay

## 2024-02-28 ENCOUNTER — Telehealth: Payer: Self-pay

## 2024-02-28 NOTE — Telephone Encounter (Signed)
 Pharmacy Patient Advocate Encounter   Received notification from Fax that prior authorization for Dimethyl Fumarate  is due for renewal.   Insurance verification completed.   The patient is insured through [employer] [facility].  Action: Patient hasn't been seen in your office in over a year. Plan requires updated chart notes for PA renewal.

## 2024-02-29 NOTE — Telephone Encounter (Signed)
 Patient is scheduled for an office visit 05/09/24 with Shane Rollins. He plans to send updated insurance card via [place]. Last picked up medication on 02/19/24 with 5 refills remaining.

## 2024-03-01 ENCOUNTER — Other Ambulatory Visit (HOSPITAL_COMMUNITY): Payer: Self-pay

## 2024-03-01 NOTE — Telephone Encounter (Signed)
 Pharmacy Patient Advocate Encounter   Received notification from Physician's Office that prior authorization for Dimethyl Fumarate  is required/requested.   Insurance verification completed.   The patient is insured through [employer] [facility].   Per test claim: PA required; PA started via CoverMyMeds. KEY [id number] . Waiting for clinical questions to populate.

## 2024-03-01 NOTE — Telephone Encounter (Signed)
 Clinical questions have been answered and PA submitted. PA currently Pending. Please be advised that most companies allow up to 30 days to make a decision. We will advise when a determination has been made, or follow up in 1 week.   Please reach out to our team, Rx Prior Auth Pool, if you haven't heard back in a week.

## 2024-03-01 NOTE — Telephone Encounter (Signed)
 Pharmacy Patient Advocate Encounter  Received notification from [facility] that Prior Authorization for Dimethyl Fumarate  has been APPROVED from 03/01/2024 to 03/01/2025   PA #/Case ID/Reference #: [id number]

## 2024-05-09 ENCOUNTER — Ambulatory Visit: Admitting: Adult Health
